# Patient Record
Sex: Male | Born: 1960 | Marital: Married | Smoking: Former smoker
Health system: Southern US, Community
[De-identification: ages and names within clinical notes are randomized; demographics above are authoritative.]

## PROBLEM LIST (undated history)

## (undated) DIAGNOSIS — H269 Unspecified cataract: Secondary | ICD-10-CM

## (undated) DIAGNOSIS — E785 Hyperlipidemia, unspecified: Secondary | ICD-10-CM

## (undated) HISTORY — PX: FOOT SURGERY: SHX648

## (undated) HISTORY — DX: Hyperlipidemia, unspecified: E78.5

## (undated) HISTORY — PX: INGUINAL HERNIA REPAIR: SUR1180

## (undated) HISTORY — DX: Unspecified cataract: H26.9

---

## 2011-08-25 ENCOUNTER — Ambulatory Visit (AMBULATORY_SURGERY_CENTER): Payer: BC Managed Care – PPO

## 2011-08-25 VITALS — Ht 68.0 in | Wt 202.0 lb

## 2011-08-25 DIAGNOSIS — Z1211 Encounter for screening for malignant neoplasm of colon: Secondary | ICD-10-CM

## 2011-08-25 DIAGNOSIS — Z800 Family history of malignant neoplasm of digestive organs: Secondary | ICD-10-CM

## 2011-08-25 MED ORDER — PEG-KCL-NACL-NASULF-NA ASC-C 100 G PO SOLR: 1.0000 | Freq: Once | ORAL | Status: DC

## 2011-09-01 ENCOUNTER — Ambulatory Visit (AMBULATORY_SURGERY_CENTER): Payer: BC Managed Care – PPO | Admitting: Gastroenterology

## 2011-09-01 ENCOUNTER — Encounter: Payer: Self-pay | Admitting: Gastroenterology

## 2011-09-01 DIAGNOSIS — D126 Benign neoplasm of colon, unspecified: Secondary | ICD-10-CM

## 2011-09-01 DIAGNOSIS — Z800 Family history of malignant neoplasm of digestive organs: Secondary | ICD-10-CM

## 2011-09-01 DIAGNOSIS — Z1211 Encounter for screening for malignant neoplasm of colon: Secondary | ICD-10-CM

## 2011-09-01 DIAGNOSIS — K5730 Diverticulosis of large intestine without perforation or abscess without bleeding: Secondary | ICD-10-CM

## 2011-09-01 MED ORDER — SODIUM CHLORIDE 0.9 % IV SOLN: 500.0000 mL | INTRAVENOUS | Status: DC

## 2011-09-01 NOTE — Op Note (Signed)
[facility] [address] [place], [place]  [address]  COLONOSCOPY PROCEDURE REPORT  PATIENT:  Roger Johnson, Roger Johnson  MR#:  #[id number] BIRTHDATE:  01-29-1961, 50 yrs. old  GENDER:  male ENDOSCOPIST:  Rachael Fee, MD REF. BY:  Tracey Harries, M.D. PROCEDURE DATE:  09/01/2011 PROCEDURE:  Colonoscopy with snare polypectomy ASA CLASS:  Class II INDICATIONS:  Elevated Risk Screening, mother had colon cancer MEDICATIONS:   Fentanyl 100 mcg IV, These medications were titrated to patient response per physician's verbal order, Versed 10 mg IV  DESCRIPTION OF PROCEDURE:   After the risks benefits and alternatives of the procedure were thoroughly explained, informed consent was obtained.  Digital rectal exam was performed and revealed no rectal masses.   The [id number] endoscope was introduced through the anus and advanced to the cecum, which was identified by both the appendix and ileocecal valve, without limitations.  The quality of the prep was good..  The instrument was then slowly withdrawn as the colon was fully examined. <<PROCEDUREIMAGES>> FINDINGS:  Three small sessile polyps were found, all were removed with cold snare and all were sent to pathology (jar 1). These ranged in size from 2-70mm across, located in transverse, descending segments (see image4 and image6).  Mild diverticulosis was found in the sigmoid to descending colon segments (see image8).  This was otherwise a normal examination of the colon (see image9, image2, and image3).   Retroflexed views in the rectum revealed no abnormalities. COMPLICATIONS:  None  ENDOSCOPIC IMPRESSION: 1) Three small polyps removed, all were removed and all were sent to pathology 2) Mild diverticulosis in the sigmoid to descending colon segments 3) Otherwise normal examination  RECOMMENDATIONS: 1) Given your significant family history of colon cancer, you should have a repeat colonoscopy in 5 years even if the  polyps removed today are NOT precancerous. 2) You will receive a letter within 1-2 weeks with the results of your biopsy as well as final recommendations. Please call my office if you have not received a letter after 3 weeks.  ______________________________ Rachael Fee, MD  n. eSIGNED:   Rachael Fee at 09/01/2011 09:38 AM  Ron Parker, #[id number]

## 2011-09-01 NOTE — Progress Notes (Signed)
Patient did not experience any of the following events: a burn prior to discharge; a fall within the facility; wrong site/side/patient/procedure/implant event; or a hospital transfer or hospital admission upon discharge from the facility. (G8907) Patient did not have preoperative order for IV antibiotic SSI prophylaxis. (G8918)  

## 2011-09-01 NOTE — Patient Instructions (Addendum)
One of your biggest health concerns is your smoking.  This increases your risk for most cancers and serious cardiovascular diseases such as strokes, heart attacks.  You should try your best to stop.  If you need assistance, please contact your PCP or Smoking Cessation Class at [facility] [phone]) or [place] (1-800-QUIT-NOW). YOU HAD AN ENDOSCOPIC PROCEDURE TODAY AT [facility]: Refer to the procedure report that was given to you for any specific questions about what was found during the examination.  If the procedure report does not answer your questions, please call your gastroenterologist to clarify.  If you requested that your care partner not be given the details of your procedure findings, then the procedure report has been included in a sealed envelope for you to review at your convenience later.  YOU SHOULD EXPECT: Some feelings of bloating in the abdomen. Passage of more gas than usual.  Walking can help get rid of the air that was put into your GI tract during the procedure and reduce the bloating. If you had a lower endoscopy (such as a colonoscopy or flexible sigmoidoscopy) you may notice spotting of blood in your stool or on the toilet paper. If you underwent a bowel prep for your procedure, then you may not have a normal bowel movement for a few days.  DIET: Your first meal following the procedure should be a light meal and then it is ok to progress to your normal diet.  A half-sandwich or bowl of soup is an example of a good first meal.  Heavy or fried foods are harder to digest and may make you feel nauseous or bloated.  Likewise meals heavy in dairy and vegetables can cause extra gas to form and this can also increase the bloating.  Drink plenty of fluids but you should avoid alcoholic beverages for 24 hours.  ACTIVITY: Your care partner should take you home directly after the procedure.  You should plan to take it easy, moving slowly for the rest of the  day.  You can resume normal activity the day after the procedure however you should NOT DRIVE or use heavy machinery for 24 hours (because of the sedation medicines used during the test).    SYMPTOMS TO REPORT IMMEDIATELY: A gastroenterologist can be reached at any hour.  During normal business hours, 8:30 AM to 5:00 PM Monday through Friday, call [phone].  After hours and on weekends, please call the GI answering service at [phone] who will take a message and have the physician on call contact you.   Following lower endoscopy (colonoscopy or flexible sigmoidoscopy):  Excessive amounts of blood in the stool  Significant tenderness or worsening of abdominal pains  Swelling of the abdomen that is new, acute  Fever of 100F or higher  Following upper endoscopy (EGD)  Vomiting of blood or coffee ground material  New chest pain or pain under the shoulder blades  Painful or persistently difficult swallowing  New shortness of breath  Fever of 100F or higher  Black, tarry-looking stools  FOLLOW UP: If any biopsies were taken you will be contacted by phone or by letter within the next 1-3 weeks.  Call your gastroenterologist if you have not heard about the biopsies in 3 weeks.  Our staff will call the home number listed on your records the next business day following your procedure to check on you and address any questions or concerns that you may have at that time regarding the information  given to you following your procedure. This is a courtesy call and so if there is no answer at the home number and we have not heard from you through the emergency physician on call, we will assume that you have returned to your regular daily activities without incident.  SIGNATURES/CONFIDENTIALITY: You and/or your care partner have signed paperwork which will be entered into your electronic medical record.  These signatures attest to the fact that that the information above on your After Visit  Summary has been reviewed and is understood.  Full responsibility of the confidentiality of this discharge information lies with you and/or your care-partner.

## 2011-09-04 ENCOUNTER — Telehealth: Payer: Self-pay

## 2011-09-04 NOTE — Telephone Encounter (Signed)
  Follow up Call-  Call back number 09/01/2011  Post procedure Call Back phone  # [phone]  Permission to leave phone message Yes     Patient questions:  Do you have a fever, pain , or abdominal swelling? no Pain Score  0 *  Have you tolerated food without any problems? yes  Have you been able to return to your normal activities? yes  Do you have any questions about your discharge instructions: Diet   no Medications  no Follow up visit  no  Do you have questions or concerns about your Care? no  Actions: * If pain score is 4 or above: No action needed, pain <4.  Per the pt he did really well. Maw

## 2011-09-06 ENCOUNTER — Encounter: Payer: Self-pay | Admitting: Gastroenterology

## 2013-09-22 DIAGNOSIS — Z87891 Personal history of nicotine dependence: Secondary | ICD-10-CM | POA: Insufficient documentation

## 2013-09-22 DIAGNOSIS — K469 Unspecified abdominal hernia without obstruction or gangrene: Secondary | ICD-10-CM | POA: Insufficient documentation

## 2013-09-22 DIAGNOSIS — R972 Elevated prostate specific antigen [PSA]: Secondary | ICD-10-CM | POA: Insufficient documentation

## 2013-09-22 DIAGNOSIS — E785 Hyperlipidemia, unspecified: Secondary | ICD-10-CM | POA: Insufficient documentation

## 2014-09-28 DIAGNOSIS — Z0000 Encounter for general adult medical examination without abnormal findings: Secondary | ICD-10-CM | POA: Insufficient documentation

## 2015-08-04 DIAGNOSIS — R972 Elevated prostate specific antigen [PSA]: Secondary | ICD-10-CM | POA: Insufficient documentation

## 2015-09-06 DIAGNOSIS — N138 Other obstructive and reflux uropathy: Secondary | ICD-10-CM | POA: Insufficient documentation

## 2015-09-06 DIAGNOSIS — N401 Enlarged prostate with lower urinary tract symptoms: Secondary | ICD-10-CM

## 2015-10-11 DIAGNOSIS — B001 Herpesviral vesicular dermatitis: Secondary | ICD-10-CM | POA: Insufficient documentation

## 2015-12-13 DIAGNOSIS — R609 Edema, unspecified: Secondary | ICD-10-CM | POA: Insufficient documentation

## 2016-01-12 DIAGNOSIS — Z1159 Encounter for screening for other viral diseases: Secondary | ICD-10-CM | POA: Insufficient documentation

## 2016-01-12 DIAGNOSIS — R739 Hyperglycemia, unspecified: Secondary | ICD-10-CM | POA: Insufficient documentation

## 2016-01-27 ENCOUNTER — Ambulatory Visit (INDEPENDENT_AMBULATORY_CARE_PROVIDER_SITE_OTHER): Payer: Managed Care, Other (non HMO) | Admitting: Podiatry

## 2016-01-27 ENCOUNTER — Encounter: Payer: Self-pay | Admitting: Podiatry

## 2016-01-27 ENCOUNTER — Ambulatory Visit (INDEPENDENT_AMBULATORY_CARE_PROVIDER_SITE_OTHER): Payer: Managed Care, Other (non HMO)

## 2016-01-27 DIAGNOSIS — M79673 Pain in unspecified foot: Secondary | ICD-10-CM | POA: Diagnosis not present

## 2016-01-27 DIAGNOSIS — M779 Enthesopathy, unspecified: Secondary | ICD-10-CM | POA: Diagnosis not present

## 2016-01-27 LAB — URIC ACID: Uric Acid, Serum: 6.3 mg/dL (ref 4.0–8.0)

## 2016-01-27 LAB — C-REACTIVE PROTEIN: CRP: <0.5 mg/dL (ref <0.60)

## 2016-01-27 LAB — RHEUMATOID FACTOR

## 2016-01-27 MED ORDER — DESOXIMETASONE 0.25 % EX CREA: 1.0000 "application " | TOPICAL_CREAM | Freq: Two times a day (BID) | CUTANEOUS | 0 refills | Status: DC

## 2016-01-27 MED ORDER — METHYLPREDNISOLONE 4 MG PO TBPK: ORAL_TABLET | ORAL | 0 refills | Status: DC

## 2016-01-27 NOTE — Progress Notes (Signed)
   Subjective:    Patient ID: Roger Johnson, male    DOB: Mar 01, 1961, 55 y.o.   MRN: [id number]  HPI: He presents today with a several week history of swelling to his bilateral lower extremity. He states that the left is worse than the right. States that he had a similar problem about a year ago was seen by an orthopedic surgeon who gave him an injection and is salt the problem. He states that has now returned he saw his primary provider who performed an echocardiogram and stated that he was fine. He is also concerned about a rash that started the same time the swelling and the pain returned. He denies a rash anywhere else on the body denies any swelling in any other joints no pain anywhere else. He denies any new shoes or wearing shoes bare footed. He does relate that he travels considerably.    Review of Systems  All other systems reviewed and are negative.      Objective:   Physical Exam: Vital signs are stable he is alert and oriented 3 in no apparent distress. Pulses are strongly palpable neurologic sensorium is intact deep tendon reflexes are intact. Muscle strength was 5 over 5 dorsiflexion plantar flexors and inverters everters all into the musculature is intact. Orthopedic evaluation does demonstrates some mild edema about the ankles overlying the sinus tarsi which appears to be the point of maximal tenderness. He has a limited subtalar joint range of motion on the right foot is post to the left foot which appears to be full and normal. He has tenderness on end range of motion of both heels. Radiographs taken today do not demonstrate any type of osseus abnormalities. Cutaneous evaluation does demonstrate a nummular rash this starts at the proximal most portion of the arch bilaterally the left extends to the lateral aspect of the foot again which appears to be a large nummular area as well developing over the [place] distribution.        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 Eczematous dermatitis plantar aspect of the bilateral foot.  Plan: I'm requesting blood work today for arthritic profile and CBC. I'm also starting him on prednisone and triamcinolone cream. I injected the bilateral sinus tarsi today with Kenalog and local anesthetic after sterile Betadine skin prep and I will follow-up with him with his labwork comes in.

## 2016-01-28 LAB — CBC WITH DIFFERENTIAL/PLATELET
BASOS ABS: 0 {cells}/uL (ref 0–200)
BASOS PCT: 0 %
EOS PCT: 2 %
Eosinophils Absolute: 134 cells/uL (ref 15–500)
HCT: 45 % (ref 38.5–50.0)
HEMOGLOBIN: 15.3 g/dL (ref 13.2–17.1)
LYMPHS ABS: 2077 {cells}/uL (ref 850–3900)
Lymphocytes Relative: 31 %
MCH: 31.9 pg (ref 27.0–33.0)
MCHC: 34 g/dL (ref 32.0–36.0)
MCV: 93.8 fL (ref 80.0–100.0)
MPV: 9.8 fL (ref 7.5–12.5)
Monocytes Absolute: 804 cells/uL (ref 200–950)
Monocytes Relative: 12 %
NEUTROS ABS: 3685 {cells}/uL (ref 1500–7800)
Neutrophils Relative %: 55 %
Platelets: 226 10*3/uL (ref 140–400)
RBC: 4.8 MIL/uL (ref 4.20–5.80)
RDW: 13.2 % (ref 11.0–15.0)
WBC: 6.7 10*3/uL (ref 3.8–10.8)

## 2016-01-28 LAB — ANA: ANA: NEGATIVE

## 2016-01-28 LAB — SEDIMENTATION RATE: Sed Rate: 1 mm/hr (ref 0–20)

## 2016-02-24 ENCOUNTER — Ambulatory Visit: Payer: Managed Care, Other (non HMO) | Admitting: Podiatry

## 2016-03-07 ENCOUNTER — Ambulatory Visit (INDEPENDENT_AMBULATORY_CARE_PROVIDER_SITE_OTHER): Payer: Managed Care, Other (non HMO) | Admitting: Podiatry

## 2016-03-07 ENCOUNTER — Encounter: Payer: Self-pay | Admitting: Podiatry

## 2016-03-07 DIAGNOSIS — L309 Dermatitis, unspecified: Secondary | ICD-10-CM | POA: Diagnosis not present

## 2016-03-07 DIAGNOSIS — M779 Enthesopathy, unspecified: Secondary | ICD-10-CM

## 2016-03-07 MED ORDER — DESOXIMETASONE 0.25 % EX CREA: 1.0000 "application " | TOPICAL_CREAM | Freq: Two times a day (BID) | CUTANEOUS | 0 refills | Status: DC

## 2016-03-07 NOTE — Progress Notes (Signed)
He presents today for follow-up of his subtalar joint capsulitis and dermatitis bilateral. He states that he is doing much better. He states I think one more shot will help and I would like a refill on medication.  Objective: Vital signs are stable he is alert 3 near-complete resolution of the dermatitis to the plantar and plantar lateral aspect of the bilateral foot. He also has minimal edema to the anterior anterolateral ankles but tender on palpation to the sinus tarsi.  Assessment: Capsulitis of the subtalar joints bilateral. Dermatitis resolving.  Plan: Reinjected his bilateral sinus tarsitis today with Kenalog and local anesthetic after sterile Betadine skin prep and refilled his Topicort. Follow up with him as needed. I did recommend that if he started to feel pain again that he is to notify us immediately and come in for a uric acid requisition.

## 2016-07-10 ENCOUNTER — Encounter: Payer: Self-pay | Admitting: Gastroenterology

## 2016-09-04 ENCOUNTER — Encounter: Payer: Self-pay | Admitting: Podiatry

## 2016-09-04 ENCOUNTER — Ambulatory Visit (INDEPENDENT_AMBULATORY_CARE_PROVIDER_SITE_OTHER): Payer: 59 | Admitting: Podiatry

## 2016-09-04 DIAGNOSIS — M7750 Other enthesopathy of unspecified foot: Secondary | ICD-10-CM

## 2016-09-04 DIAGNOSIS — M779 Enthesopathy, unspecified: Secondary | ICD-10-CM

## 2016-09-04 NOTE — Progress Notes (Signed)
He presents today with a chief complaint of painful subtalar joints bilateral. He states that his rash healed up quite nicely. States that his ankles have only been hurting him for the past couple of weeks.  Objective: Vital signs are stable alert and oriented 3. Pulses are palpable. Neurologic sensorium is intact. Deep tendon reflexes are intact. Muscle strength is intact. He has pain on end range of motion of the subtalar joints bilateral.  Assessment: Subtalar joint capsulitis bilateral.  Plan: He was scanned for set of orthotics today and also injected bilateral subtalar joints.

## 2016-09-08 DIAGNOSIS — R7309 Other abnormal glucose: Secondary | ICD-10-CM | POA: Insufficient documentation

## 2016-09-20 DIAGNOSIS — H25091 Other age-related incipient cataract, right eye: Secondary | ICD-10-CM | POA: Insufficient documentation

## 2016-10-09 ENCOUNTER — Ambulatory Visit: Payer: 59 | Admitting: Podiatry

## 2016-10-11 ENCOUNTER — Encounter: Payer: Self-pay | Admitting: *Deleted

## 2016-10-11 ENCOUNTER — Ambulatory Visit: Payer: 59 | Admitting: Podiatry

## 2016-10-18 ENCOUNTER — Ambulatory Visit (INDEPENDENT_AMBULATORY_CARE_PROVIDER_SITE_OTHER): Payer: 59 | Admitting: Podiatry

## 2016-10-18 ENCOUNTER — Encounter: Payer: Self-pay | Admitting: Podiatry

## 2016-10-18 ENCOUNTER — Encounter: Payer: Self-pay | Admitting: Family Medicine

## 2016-10-18 DIAGNOSIS — M779 Enthesopathy, unspecified: Secondary | ICD-10-CM

## 2016-10-18 NOTE — Progress Notes (Signed)
Dispensed patient's orthotics with oral and written instructions for wearing. Patient will follow up with Dr. Milinda Pointer in 6 week for an orthotic check.

## 2016-10-18 NOTE — Patient Instructions (Signed)

## 2017-07-03 ENCOUNTER — Other Ambulatory Visit: Payer: Self-pay | Admitting: *Deleted

## 2017-07-03 MED ORDER — DESOXIMETASONE 0.25 % EX CREA: 1.0000 "application " | TOPICAL_CREAM | Freq: Two times a day (BID) | CUTANEOUS | 1 refills | Status: DC

## 2017-07-04 ENCOUNTER — Encounter: Payer: Self-pay | Admitting: Gastroenterology

## 2017-09-18 ENCOUNTER — Other Ambulatory Visit: Payer: Self-pay

## 2017-09-18 ENCOUNTER — Ambulatory Visit (AMBULATORY_SURGERY_CENTER): Payer: Self-pay

## 2017-09-18 VITALS — Ht 70.0 in | Wt 207.8 lb

## 2017-09-18 DIAGNOSIS — Z86010 Personal history of colonic polyps: Secondary | ICD-10-CM

## 2017-09-18 MED ORDER — PEG 3350-KCL-NA BICARB-NACL 420 G PO SOLR: 4000.0000 mL | Freq: Once | ORAL | 0 refills | Status: DC

## 2017-09-19 ENCOUNTER — Encounter: Payer: Self-pay | Admitting: Gastroenterology

## 2017-09-24 HISTORY — PX: COLONOSCOPY: SHX174

## 2017-10-02 ENCOUNTER — Encounter: Payer: Self-pay | Admitting: Gastroenterology

## 2017-10-02 ENCOUNTER — Ambulatory Visit (AMBULATORY_SURGERY_CENTER): Payer: BLUE CROSS/BLUE SHIELD | Admitting: Gastroenterology

## 2017-10-02 ENCOUNTER — Other Ambulatory Visit: Payer: Self-pay

## 2017-10-02 VITALS — BP 118/72 | HR 68 | Temp 98.6°F | Resp 12 | Wt 207.0 lb

## 2017-10-02 DIAGNOSIS — D122 Benign neoplasm of ascending colon: Secondary | ICD-10-CM

## 2017-10-02 DIAGNOSIS — D126 Benign neoplasm of colon, unspecified: Secondary | ICD-10-CM | POA: Diagnosis not present

## 2017-10-02 DIAGNOSIS — Z800 Family history of malignant neoplasm of digestive organs: Secondary | ICD-10-CM | POA: Diagnosis not present

## 2017-10-02 DIAGNOSIS — D125 Benign neoplasm of sigmoid colon: Secondary | ICD-10-CM

## 2017-10-02 DIAGNOSIS — K5730 Diverticulosis of large intestine without perforation or abscess without bleeding: Secondary | ICD-10-CM

## 2017-10-02 DIAGNOSIS — K635 Polyp of colon: Secondary | ICD-10-CM | POA: Diagnosis not present

## 2017-10-02 DIAGNOSIS — Z86010 Personal history of colonic polyps: Secondary | ICD-10-CM | POA: Diagnosis present

## 2017-10-02 MED ORDER — SODIUM CHLORIDE 0.9 % IV SOLN: 500.0000 mL | Freq: Once | INTRAVENOUS | Status: DC

## 2017-10-02 NOTE — Patient Instructions (Signed)
YOU HAD AN ENDOSCOPIC PROCEDURE TODAY AT THE [facility]:   Refer to the procedure report that was given to you for any specific questions about what was found during the examination.  If the procedure report does not answer your questions, please call your gastroenterologist to clarify.  If you requested that your care partner not be given the details of your procedure findings, then the procedure report has been included in a sealed envelope for you to review at your convenience later.  YOU SHOULD EXPECT: Some feelings of bloating in the abdomen. Passage of more gas than usual.  Walking can help get rid of the air that was put into your GI tract during the procedure and reduce the bloating. If you had a lower endoscopy (such as a colonoscopy or flexible sigmoidoscopy) you may notice spotting of blood in your stool or on the toilet paper. If you underwent a bowel prep for your procedure, you may not have a normal bowel movement for a few days.  Please Note:  You might notice some irritation and congestion in your nose or some drainage.  This is from the oxygen used during your procedure.  There is no need for concern and it should clear up in a day or so.  SYMPTOMS TO REPORT IMMEDIATELY:   Following lower endoscopy (colonoscopy or flexible sigmoidoscopy):  Excessive amounts of blood in the stool  Significant tenderness or worsening of abdominal pains  Swelling of the abdomen that is new, acute  Fever of 100F or higher   For urgent or emergent issues, a gastroenterologist can be reached at any hour by calling [phone].   DIET:  We do recommend a small meal at first, but then you may proceed to your regular diet.  Drink plenty of fluids but you should avoid alcoholic beverages for 24 hours. Try to increase the fiber in your diet, and drink plenty of water.  ACTIVITY:  You should plan to take it easy for the rest of today and you should NOT DRIVE or use heavy machinery until  tomorrow (because of the sedation medicines used during the test).    FOLLOW UP: Our staff will call the number listed on your records the next business day following your procedure to check on you and address any questions or concerns that you may have regarding the information given to you following your procedure. If we do not reach you, we will leave a message.  However, if you are feeling well and you are not experiencing any problems, there is no need to return our call.  We will assume that you have returned to your regular daily activities without incident.  If any biopsies were taken you will be contacted by phone or by letter within the next 1-3 weeks.  Please call us at [phone] if you have not heard about the biopsies in 3 weeks.    SIGNATURES/CONFIDENTIALITY: You and/or your care partner have signed paperwork which will be entered into your electronic medical record.  These signatures attest to the fact that that the information above on your After Visit Summary has been reviewed and is understood.  Full responsibility of the confidentiality of this discharge information lies with you and/or your care-partner.  Read all handouts given to you by your recovery room nurse. 

## 2017-10-02 NOTE — Progress Notes (Signed)
Called to room to assist during endoscopic procedure.  Patient ID and intended procedure confirmed with present staff. Received instructions for my participation in the procedure from the performing physician.  

## 2017-10-02 NOTE — Op Note (Signed)
[facility] Patient Name: Roger Johnson Procedure Date: 10/02/2017 9:23 AM MRN: [id number] Endoscopist: Milus Banister , MD Age: 57 Referring MD:  Date of Birth: 1961-05-18 Gender: Male Account #: [id number] Procedure:                Colonoscopy Indications:              High risk colon cancer surveillance: Personal                            history of colonic polyps (colonoscopy 2013 two                            subCM adenomas), also mother had colon cancer                            (diagnosed at age 65) Medicines:                Monitored Anesthesia Care Procedure:                Pre-Anesthesia Assessment:                           - Prior to the procedure, a History and Physical                            was performed, and patient medications and                            allergies were reviewed. The patient's tolerance of                            previous anesthesia was also reviewed. The risks                            and benefits of the procedure and the sedation                            options and risks were discussed with the patient.                            All questions were answered, and informed consent                            was obtained. Prior Anticoagulants: The patient has                            taken no previous anticoagulant or antiplatelet                            agents. ASA Grade Assessment: II - A patient with                            mild systemic disease. After reviewing the risks  and benefits, the patient was deemed in                            satisfactory condition to undergo the procedure.                           After obtaining informed consent, the colonoscope                            was passed under direct vision. Throughout the                            procedure, the patient's blood pressure, pulse, and                            oxygen saturations were monitored continuously. The                             Model CF-HQ190L [id number]) scope was introduced                            through the anus and advanced to the the cecum,                            identified by appendiceal orifice and ileocecal                            valve. The colonoscopy was performed without                            difficulty. The patient tolerated the procedure                            well. The quality of the bowel preparation was                            good. The ileocecal valve, appendiceal orifice, and                            rectum were photographed. Scope In: 9:31:14 AM Scope Out: 9:42:01 AM Scope Withdrawal Time: 0 hours 8 minutes 9 seconds  Total Procedure Duration: 0 hours 10 minutes 47 seconds  Findings:                 Two sessile polyps were found in the sigmoid colon                            and ascending colon. The polyps were 2 to 4 mm in                            size. These polyps were removed with a cold snare.                            Resection and retrieval were  complete.                           A few small-mouthed diverticula were found in the                            left colon.                           The exam was otherwise without abnormality on                            direct and retroflexion views. Complications:            No immediate complications. Estimated blood loss:                            None. Estimated Blood Loss:     Estimated blood loss: none. Impression:               - Two 2 to 4 mm polyps in the sigmoid colon and in                            the ascending colon, removed with a cold snare.                            Resected and retrieved.                           - Diverticulosis in the left colon.                           - The examination was otherwise normal on direct                            and retroflexion views. Recommendation:           - Patient has a contact number available for                             emergencies. The signs and symptoms of potential                            delayed complications were discussed with the                            patient. Return to normal activities tomorrow.                            Written discharge instructions were provided to the                            patient.                           - Resume previous diet.                           -  Continue present medications.                           You will receive a letter within 2-3 weeks with the                            pathology results and my final recommendations.                           If the polyp(s) is proven to be 'pre-cancerous' on                            pathology, you will need repeat colonoscopy in 5                            years. Milus Banister, MD 10/02/2017 9:46:51 AM This report has been signed electronically.

## 2017-10-02 NOTE — Progress Notes (Signed)
To PACU, VSS. Report to [email or web address] 

## 2017-10-03 ENCOUNTER — Telehealth: Payer: Self-pay

## 2017-10-03 ENCOUNTER — Telehealth: Payer: Self-pay | Admitting: *Deleted

## 2017-10-03 NOTE — Telephone Encounter (Signed)
  Follow up Call-  Call back number 10/02/2017  Post procedure Call Back phone  # [phone]  Permission to leave phone message Yes  Some recent data might be hidden     Patient questions:  Do you have a fever, pain , or abdominal swelling? No. Pain Score  0 *  Have you tolerated food without any problems? Yes.    Have you been able to return to your normal activities? Yes.    Do you have any questions about your discharge instructions: Diet   No. Medications  No. Follow up visit  No.  Do you have questions or concerns about your Care? No.  Actions: * If pain score is 4 or above: No action needed, pain <4.

## 2017-10-03 NOTE — Telephone Encounter (Signed)
NO ANSWER, MESSAGE LEFT FOR PATIENT. 

## 2017-10-08 ENCOUNTER — Encounter: Payer: Self-pay | Admitting: Gastroenterology

## 2018-03-26 ENCOUNTER — Other Ambulatory Visit: Payer: Self-pay | Admitting: Ophthalmology

## 2018-09-28 ENCOUNTER — Other Ambulatory Visit: Payer: Self-pay | Admitting: Podiatry

## 2018-11-20 DIAGNOSIS — M7711 Lateral epicondylitis, right elbow: Secondary | ICD-10-CM | POA: Insufficient documentation

## 2019-10-15 ENCOUNTER — Encounter: Payer: Self-pay | Admitting: Podiatry

## 2019-10-15 ENCOUNTER — Ambulatory Visit (INDEPENDENT_AMBULATORY_CARE_PROVIDER_SITE_OTHER): Payer: Managed Care, Other (non HMO) | Admitting: Podiatry

## 2019-10-15 ENCOUNTER — Other Ambulatory Visit: Payer: Self-pay

## 2019-10-15 ENCOUNTER — Ambulatory Visit (INDEPENDENT_AMBULATORY_CARE_PROVIDER_SITE_OTHER): Payer: Managed Care, Other (non HMO)

## 2019-10-15 VITALS — Temp 97.4°F

## 2019-10-15 DIAGNOSIS — M7752 Other enthesopathy of left foot: Secondary | ICD-10-CM

## 2019-10-15 NOTE — Progress Notes (Signed)
He presents today with chief complaint of pain to the dorsal lateral aspect of his left foot and ankle.  Feels like something pinching in the area and then it goes away.  Objective: Vital signs are stable he is alert oriented x3 there is no erythema edema cellulitis drainage or odor he has pain on palpation sinus tarsi and pain on end range of motion.  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  Assessment: Subtalar joint changes.  With capsulitis.   Plan: I injected 20 mg Kenalog 5 mg Marcaine point maximal tenderness after sterile Betadine skin prep left sinus tarsi.  Tolerated procedure well follow-up with him as needed

## 2020-02-18 ENCOUNTER — Encounter: Payer: Self-pay | Admitting: Podiatry

## 2020-02-18 ENCOUNTER — Ambulatory Visit (INDEPENDENT_AMBULATORY_CARE_PROVIDER_SITE_OTHER): Payer: Managed Care, Other (non HMO) | Admitting: Podiatry

## 2020-02-18 ENCOUNTER — Other Ambulatory Visit: Payer: Self-pay

## 2020-02-18 DIAGNOSIS — S96912A Strain of unspecified muscle and tendon at ankle and foot level, left foot, initial encounter: Secondary | ICD-10-CM

## 2020-02-18 MED ORDER — METHYLPREDNISOLONE 4 MG PO TBPK: ORAL_TABLET | ORAL | 0 refills | Status: DC

## 2020-02-18 MED ORDER — MELOXICAM 15 MG PO TABS: 15.0000 mg | ORAL_TABLET | Freq: Every day | ORAL | 3 refills | Status: DC

## 2020-02-18 NOTE — Progress Notes (Signed)
He presents today still having severe pain and swelling on the lateral aspect of his left foot and ankle.  He states that the injection that we gave him made it feel better but it still swells considerably.  He states that he takes an Aleve or an Advil which does help occasionally.  Objective: Vital signs are stable he is alert oriented x3.  Pulses are palpable.  Left ankle demonstrates edema along the lateral malleolus as well as beneath the lateral malleolus.  On palpation is easily palpable that is peroneus longus is a functioning very well and then on proximal evaluation just behind the lateral malleolus there appears to be a nodule present.  This nodule is more than likely a tear of the peroneus longus that is resulted in retraction.  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  Plan: At this point were going to request an MRI of the left ankle evaluate the tear in the peroneus longus for surgical consideration and planning.  I also want to go ahead and start him on methylprednisolone just to help alleviate his symptoms I do think in taking that as well as a single meloxicam daily I will help alleviate his symptoms until we can work on this.  He will also continue to wear his compression anklet as he travels a lot.

## 2020-02-19 ENCOUNTER — Other Ambulatory Visit: Payer: Self-pay | Admitting: Podiatry

## 2020-02-20 ENCOUNTER — Other Ambulatory Visit: Payer: Self-pay

## 2020-03-02 ENCOUNTER — Ambulatory Visit: Payer: Self-pay

## 2020-03-05 ENCOUNTER — Other Ambulatory Visit: Payer: Self-pay

## 2020-03-05 DIAGNOSIS — S96912A Strain of unspecified muscle and tendon at ankle and foot level, left foot, initial encounter: Secondary | ICD-10-CM

## 2020-03-08 ENCOUNTER — Telehealth: Payer: Self-pay | Admitting: Podiatry

## 2020-03-08 NOTE — Telephone Encounter (Signed)
[name] (Social research officer, government) called on his behalf to check status of authorization and if they can schedule MRI yet with [facility] in [place] (he is in [place] currently)

## 2020-03-10 NOTE — Telephone Encounter (Signed)
Returned call to [facility] and left vm that order has been refaxed to [facility] and he should be able to call and schedule his appt for MRI.

## 2020-04-07 ENCOUNTER — Ambulatory Visit: Payer: Managed Care, Other (non HMO) | Admitting: Podiatry

## 2020-04-07 ENCOUNTER — Other Ambulatory Visit: Payer: Self-pay

## 2020-04-07 ENCOUNTER — Encounter: Payer: Self-pay | Admitting: Podiatry

## 2020-04-07 DIAGNOSIS — M7672 Peroneal tendinitis, left leg: Secondary | ICD-10-CM

## 2020-04-07 NOTE — Progress Notes (Signed)
He presents today for follow-up of his peroneal tendinitis of his left foot and ankle.  He states that is still sore but as long as he wears his compression garment he feels much better.  He states that he is able to play tennis to some degree but nothing crazy.  Objective: Vital signs are stable he is alert oriented x3 pulses are palpable still has swelling and tenderness along the peroneal tendons particularly posterior to the lateral malleolus.  MRI does indicate a tear of the peroneus brevis about 2 mm from the right foot concluding prior to attachment of the fifth metatarsal.  Peroneus longus appears to demonstrate peroneal tendinitis also has some osteoarthritis of the fourth fifth tarsometatarsal joint left.  Assessment: Peroneal tendinitis peroneal tear osteoarthritis.  Plan: At this point I injected dexamethasone local anesthetic to the point of maximal tenderness I will follow-up with him in 6 weeks for surgical consult for January.  He understands that he is going to be in a cast for 4 to 6 weeks and I will follow-up with him at that time.

## 2020-05-26 ENCOUNTER — Encounter: Payer: Self-pay | Admitting: Podiatry

## 2020-05-26 ENCOUNTER — Other Ambulatory Visit: Payer: Self-pay

## 2020-05-26 ENCOUNTER — Ambulatory Visit: Payer: Managed Care, Other (non HMO) | Admitting: Podiatry

## 2020-05-26 DIAGNOSIS — M7672 Peroneal tendinitis, left leg: Secondary | ICD-10-CM | POA: Diagnosis not present

## 2020-05-26 NOTE — Progress Notes (Signed)
He presents today for follow-up of his peroneal tendinitis states that have started PT at home on my own using Voltaren gel and ice therapy and a TENS unit.  He states that is really been helping a lot he states that he is about 80 to 90% improved at this point.  Objective: Vital signs stable he is alert and oriented x3 pulses are palpable.  He has no reproducible pain on palpation or range of motion.  Assessment: Well-healing tears of the peroneal tendons left.  Plan: Discussed etiology pathology and surgical therapies this point time we will continue current therapies and I did place him in a Tri-Lock brace to help with tennis and I will follow-up with him as needed.

## 2020-06-30 ENCOUNTER — Ambulatory Visit: Payer: Managed Care, Other (non HMO) | Admitting: Podiatry

## 2020-07-01 ENCOUNTER — Ambulatory Visit: Payer: Managed Care, Other (non HMO) | Admitting: Podiatry

## 2020-07-01 ENCOUNTER — Other Ambulatory Visit: Payer: Self-pay

## 2020-07-01 DIAGNOSIS — S86312A Strain of muscle(s) and tendon(s) of peroneal muscle group at lower leg level, left leg, initial encounter: Secondary | ICD-10-CM

## 2020-07-01 DIAGNOSIS — M19079 Primary osteoarthritis, unspecified ankle and foot: Secondary | ICD-10-CM

## 2020-07-02 ENCOUNTER — Encounter: Payer: Self-pay | Admitting: Podiatry

## 2020-07-05 NOTE — Progress Notes (Signed)
Subjective:  Patient ID: Roger Johnson, male    DOB: 09-09-1960,  MRN: [id number]  Chief Complaint  Patient presents with  . peroneal teninitis    PT stated he has a split tendon to the left ankle    60 y.o. male presents with the above complaint. History confirmed with patient.  This is a chronic issue for him that has been going on for some time.  He has previously seen Dr. Milinda Pointer for this and has undergone injections which helped somewhat but are no longer helpful.  He saw orthopedist at [facility] who discussed the tendon tear with him found on the MRI that Dr. Milinda Pointer sent him for in October of this year.  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  He also knows Dr. Paulla Dolly here at this practice who referred him to me for surgical evaluation.  Objective:  Physical Exam: warm, good capillary refill, no trophic changes or ulcerative lesions, normal DP and PT pulses and normal sensory exam. Left Foot: He has pain in the retromalleolar groove along the peroneal tendons.  5 out of 5 strength but does have pain with resisted eversion of the foot.  There is an area of swelling and edema in the retromalleolar groove as well.  No pain at the insertion of the fifth metatarsal base or in the plantar arch.  No ankle instability.   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 No intravenous contrast was administered.  COMPARISON: None.  FINDINGS: TENDONS  Peroneal: There is a longitudinal split tear of the peroneal brevis tendon at the level of the hindfoot of the which appears to extend approximately 2 cm in length with a normal distal insertion site. There is increased intrasubstance signal seen within the peroneal longus tendon. Fluid is seen surrounding  the peroneal tendons.  Posteromedial: Posterior tibial tendon intact. Flexor hallucis longus tendon intact. Flexor digitorum longus tendon intact.  Anterior: Tibialis anterior tendon intact. Extensor hallucis longus tendon intact Extensor digitorum longus tendon intact.  Achilles: There is mildly increased signal seen within a 2 cm segment of Achilles tendon at the insertion site.  Plantar Fascia: Intact.  LIGAMENTS  Lateral: Anterior talofibular ligament intact. Calcaneofibular ligament intact. Posterior talofibular ligament intact. Anterior and posterior tibiofibular ligaments intact.  Medial: There is heterogeneous increased signal seen within the deltoid ligament, however it is intact.[place] ligament intact.  CARTILAGE  Ankle Joint: No joint effusion. Normal ankle mortise. No chondral defect.  Subtalar Joints/Sinus Tarsi: Normal subtalar joints. No subtalar joint effusion. Normal sinus tarsi.  Bones: Advanced osteoarthritis is seen at the calcaneocuboid joint with joint space loss, surrounding marrow edema and subchondral cystic changes. There is also a small amount of joint fluid extending from the calcaneocuboid joint laterally.  Soft Tissue: Mild soft tissue edema seen on the plantar surface of the midfoot adjacent to the calcaneal cuboid joint. There is also soft tissue edema seen around the lateral ankle. A small amount of fluid is seen within the syndesmosis.  IMPRESSION: Longitudinal split tear of the peroneal brevis tendon at the level of the hindfoot with a normal distal insertion site.  Mild-to-moderate peroneal tenosynovitis  Advanced calcaneocuboid joint osteoarthritis  Soft tissue edema around the lateral plantar surface of the midfoot and the lateral ankle   Electronically Signed  By: Prudencio Pair M.D. On: 03/29/2020 22:09 Assessment:   1. Tear of peroneal tendon, left, initial encounter   2. Osteoarthritis of calcaneocuboid joint       Plan:  Patient was evaluated and treated and all questions answered.   Discussed with the patient his radiographic and clinical findings.  His MRI report showed a 2 cm split tear in the peroneus brevis along the lateral ankle with normal distal insertion.  I discussed with him how his cavus foot type contributes to this.  He has no findings of ankle instability.  We discussed repair of the tear.  So far he has not improved with nonsurgical treatment.  He desires surgical correction.  We discussed the possible risks and benefits of this including not limited to pain, swelling, infection, scar, numbness which may be temporary or permanent, chronic pain, stiffness, nerve pain or damage, wound healing problems.  I do not think at this point that considering cavus reconstruction would be necessary although he is at a higher risk for retearing this.  If this occurs it would be something to consider for the future.  Informed consent was signed and reviewed and surgery scheduled mutually agreeable date.  We discussed the postoperative course including the period of nonweightbearing for approximately 1 month and then transition to protected weightbearing in a CAM boot and physical therapy.   Following the visit I was able to independently review the images on the CD that he brought before his MRI.  It does note in the images show significant osteoarthrosis of the calcaneocuboid joint.  He did not seem to have a significant amount of pain clinically here today.  I discussed with him that prior to surgery he could return and we could perform a diagnostic corticosteroid injection to see if this offers any pain relief.  If it does then I think it is reasonable to consider discussing the option of calcaneocuboid arthrodesis.  If not then continue with nonsurgical treatment for the arthritis and proceed with primary tendon repair.   No follow-ups on file.

## 2020-07-07 ENCOUNTER — Ambulatory Visit: Payer: Managed Care, Other (non HMO) | Admitting: Podiatry

## 2020-07-08 ENCOUNTER — Telehealth: Payer: Self-pay

## 2020-07-08 ENCOUNTER — Other Ambulatory Visit: Payer: Self-pay

## 2020-07-08 ENCOUNTER — Ambulatory Visit: Payer: Managed Care, Other (non HMO) | Admitting: Podiatry

## 2020-07-08 DIAGNOSIS — M19079 Primary osteoarthritis, unspecified ankle and foot: Secondary | ICD-10-CM

## 2020-07-08 DIAGNOSIS — S86312A Strain of muscle(s) and tendon(s) of peroneal muscle group at lower leg level, left leg, initial encounter: Secondary | ICD-10-CM

## 2020-07-08 NOTE — Telephone Encounter (Signed)
DOS 07/23/2020  REPAIR TORN TENDON LT - 28202  [employer] EFFECTIVE DATE - 12/25/2018  PLAN DEDUCTIBLE - $2500.00 D/$030.13 MET OUT OF POCKET - $5000.00 H/$438.88 MET   PER AUTOMATED SYSTEM, NO PRECERT REQUIRED FOR CPT 28202. CONF # [id number]

## 2020-07-11 ENCOUNTER — Encounter: Payer: Self-pay | Admitting: Podiatry

## 2020-07-11 NOTE — Progress Notes (Signed)
I asked him to return following review of his MRI to evaluate his calcaneocuboid joint.  On further discussion and examination he does not have pain that is significantly consistent in the calcaneocuboid joint, he has predominantly pain along the peroneal tendons, the point of maximal tenderness is in the retromalleolar groove.  We decided that we will proceed with peroneal tendon repair as previously proposed, if the CCJ becomes an issue later we will consider injection therapy and potential arthrodesis if required.  Addressed further questions about his procedure as well as the postoperative course.

## 2020-07-23 ENCOUNTER — Other Ambulatory Visit: Payer: Self-pay | Admitting: Podiatry

## 2020-07-23 DIAGNOSIS — M65872 Other synovitis and tenosynovitis, left ankle and foot: Secondary | ICD-10-CM

## 2020-07-23 DIAGNOSIS — M66372 Spontaneous rupture of flexor tendons, left ankle and foot: Secondary | ICD-10-CM | POA: Diagnosis not present

## 2020-07-23 DIAGNOSIS — M7672 Peroneal tendinitis, left leg: Secondary | ICD-10-CM | POA: Diagnosis not present

## 2020-07-23 MED ORDER — OXYCODONE HCL 5 MG PO TABS
5.0000 mg | ORAL_TABLET | ORAL | 0 refills | Status: AC | PRN
Start: 2020-07-23 — End: 2020-07-30

## 2020-07-23 MED ORDER — IBUPROFEN 600 MG PO TABS: 600.0000 mg | ORAL_TABLET | Freq: Four times a day (QID) | ORAL | 0 refills | Status: AC | PRN

## 2020-07-23 MED ORDER — OXYCODONE HCL 5 MG PO TABS: 5.0000 mg | ORAL_TABLET | ORAL | 0 refills | Status: DC | PRN

## 2020-07-23 MED ORDER — ASPIRIN EC 325 MG PO TBEC: 325.0000 mg | DELAYED_RELEASE_TABLET | Freq: Two times a day (BID) | ORAL | 0 refills | Status: AC

## 2020-07-23 MED ORDER — ACETAMINOPHEN 500 MG PO TABS: 1000.0000 mg | ORAL_TABLET | Freq: Four times a day (QID) | ORAL | 0 refills | Status: AC | PRN

## 2020-07-23 MED ORDER — GABAPENTIN 300 MG PO CAPS: 300.0000 mg | ORAL_CAPSULE | Freq: Three times a day (TID) | ORAL | 0 refills | Status: DC

## 2020-07-23 NOTE — Addendum Note (Signed)
Addended bySherryle Lis, Elenora Hawbaker R on: 07/23/2020 09:46 AM   Modules accepted: Orders

## 2020-07-23 NOTE — Progress Notes (Signed)
07/23/20 [place] peroneal tendon repair left

## 2020-07-27 ENCOUNTER — Encounter: Payer: Self-pay | Admitting: Podiatry

## 2020-07-29 ENCOUNTER — Encounter: Payer: Self-pay | Admitting: Podiatry

## 2020-07-29 ENCOUNTER — Other Ambulatory Visit: Payer: Self-pay

## 2020-07-29 ENCOUNTER — Ambulatory Visit (INDEPENDENT_AMBULATORY_CARE_PROVIDER_SITE_OTHER): Payer: Managed Care, Other (non HMO) | Admitting: Podiatry

## 2020-07-29 DIAGNOSIS — S86312A Strain of muscle(s) and tendon(s) of peroneal muscle group at lower leg level, left leg, initial encounter: Secondary | ICD-10-CM

## 2020-07-29 DIAGNOSIS — Z9889 Other specified postprocedural states: Secondary | ICD-10-CM

## 2020-07-30 ENCOUNTER — Encounter: Payer: Self-pay | Admitting: Podiatry

## 2020-07-30 NOTE — Progress Notes (Signed)
  Subjective:  Patient ID: Roger Johnson, male    DOB: Mar 18, 1961,  MRN: [id number]  Chief Complaint  Patient presents with  . Routine Post Op    PT stated that he is doing well he has no pain and no major concerns at this time    DOS: 07/23/20 Procedure: left ankle peroneus brevis  Tendon reconstruction with tenodesis and allografting  59 y.o. male returns for post-op check. Doing well. Minimal pain  Review of Systems: Negative except as noted in the HPI. Denies N/V/F/Ch.   Objective:  There were no vitals filed for this visit. There is no height or weight on file to calculate BMI. Constitutional Well developed. Well nourished.  Vascular Foot warm and well perfused. Capillary refill normal to all digits.   Neurologic Normal speech. Oriented to person, place, and time. Epicritic sensation to light touch grossly present bilaterally.  Dermatologic Skin healing well without signs of infection. Skin edges well coapted without signs of infection.  Orthopedic: Tenderness to palpation noted about the surgical site.    Assessment:  No diagnosis found. Plan:  Patient was evaluated and treated and all questions answered.  S/p foot surgery left -Progressing as expected post-operatively. -WB Status: NWB in posterior splint -Sutures: will leave intact for 1 more week. -Medications:  No refills required. Continue aspirin -Foot redressed.  Return in about 1 week (around 08/05/2020) for suture removal POV #2.

## 2020-08-05 ENCOUNTER — Ambulatory Visit (INDEPENDENT_AMBULATORY_CARE_PROVIDER_SITE_OTHER): Payer: Managed Care, Other (non HMO) | Admitting: Podiatry

## 2020-08-05 ENCOUNTER — Other Ambulatory Visit: Payer: Self-pay

## 2020-08-05 DIAGNOSIS — Z9889 Other specified postprocedural states: Secondary | ICD-10-CM

## 2020-08-05 DIAGNOSIS — S86312A Strain of muscle(s) and tendon(s) of peroneal muscle group at lower leg level, left leg, initial encounter: Secondary | ICD-10-CM

## 2020-08-05 NOTE — Patient Instructions (Signed)
You may begin bathing. OK to remove the boot and ACE to bathe  Sleep in the boot. No weight on the foot yet  After next visit we will begin WB in the boot and physical therapy

## 2020-08-08 ENCOUNTER — Encounter: Payer: Self-pay | Admitting: Podiatry

## 2020-08-08 NOTE — Progress Notes (Signed)
  Subjective:  Patient ID: Roger Johnson, male    DOB: [birth year],  MRN: [id number]  Chief Complaint  Patient presents with  . Routine Post Op    Pt stated that he is doing well he denies pain at this time and has no major concerns     DOS: 07/23/20 Procedure: left ankle peroneus brevis  Tendon reconstruction with tenodesis and allografting  60 y.o. male returns for post-op check. Doing well. Minimal pain  Review of Systems: Negative except as noted in the HPI. Denies N/V/F/Ch.   Objective:  There were no vitals filed for this visit. There is no height or weight on file to calculate BMI. Constitutional Well developed. Well nourished.  Vascular Foot warm and well perfused. Capillary refill normal to all digits.   Neurologic Normal speech. Oriented to person, place, and time. Epicritic sensation to light touch grossly present bilaterally.  Dermatologic Skin healing well without signs of infection. Skin edges well coapted without signs of infection.  Orthopedic: Tenderness to palpation noted about the surgical site.    Assessment:  No diagnosis found. Plan:  Patient was evaluated and treated and all questions answered.  S/p foot surgery left -Progressing as expected post-operatively. -WB Status: NWB in CAM boot with plantarflexion wedges -Sutures: removed today, steri strips applied -Medications:  No refills required. Continue aspirin -Foot redressed.  No follow-ups on file.

## 2020-08-09 ENCOUNTER — Encounter: Payer: Self-pay | Admitting: Podiatry

## 2020-08-24 ENCOUNTER — Ambulatory Visit (INDEPENDENT_AMBULATORY_CARE_PROVIDER_SITE_OTHER): Payer: Managed Care, Other (non HMO) | Admitting: Podiatry

## 2020-08-24 ENCOUNTER — Other Ambulatory Visit: Payer: Self-pay

## 2020-08-24 DIAGNOSIS — S86312A Strain of muscle(s) and tendon(s) of peroneal muscle group at lower leg level, left leg, initial encounter: Secondary | ICD-10-CM

## 2020-08-24 DIAGNOSIS — Z9889 Other specified postprocedural states: Secondary | ICD-10-CM

## 2020-08-25 NOTE — Progress Notes (Signed)
  Subjective:  Patient ID: Roger Johnson, male    DOB: [birth year],  MRN: [id number]  Chief Complaint  Patient presents with  . Routine Post Op    Pt stated that he is doing well denies any pain at this time.    DOS: 07/23/20 Procedure: left ankle peroneus brevis  Tendon reconstruction with tenodesis and allografting  60 y.o. male returns for post-op check. Doing well. Minimal pain  Review of Systems: Negative except as noted in the HPI. Denies N/V/F/Ch.   Objective:  There were no vitals filed for this visit. There is no height or weight on file to calculate BMI. Constitutional Well developed. Well nourished.  Vascular Foot warm and well perfused. Capillary refill normal to all digits.   Neurologic Normal speech. Oriented to person, place, and time. Epicritic sensation to light touch grossly present bilaterally.  Dermatologic Skin healing well without signs of infection. Skin edges well coapted without signs of infection.  Orthopedic: Tenderness to palpation noted about the surgical site.    Assessment:   1. Tear of peroneal tendon, left, initial encounter   2. Post-operative state    Plan:  Patient was evaluated and treated and all questions answered.  S/p foot surgery left -Progressing as expected post-operatively. -WB Status: May begin WB in the CAM boot with the 2 remaining plantarflexion wedges, remove the remainder today -Starting postop week 6 (Friday after next) he may begin ambulating short distances (e.g. from bed to bathroom) with the Ace wrap without the boot -Begin physical therapy and range of motion exercises, nonweightbearing exercises for now, may begin weightbearing exercises that postop week 6   No follow-ups on file.

## 2020-09-07 ENCOUNTER — Encounter: Payer: Self-pay | Admitting: Podiatry

## 2020-09-07 ENCOUNTER — Telehealth: Payer: Self-pay | Admitting: Podiatry

## 2020-09-07 NOTE — Telephone Encounter (Signed)
Patient called and stated that he is having some inflammation around the stitches and wanted to know if he could be seen today instead of Thursday.

## 2020-09-07 NOTE — Telephone Encounter (Signed)
Patient was just leaving PT, he is scheduled with you in the [place] location  tomorrow at 9:45

## 2020-09-07 NOTE — Telephone Encounter (Signed)
Yeah I can see him at 4:30. Thanks

## 2020-09-07 NOTE — Telephone Encounter (Signed)
Sounds good thank you

## 2020-09-08 ENCOUNTER — Other Ambulatory Visit: Payer: Self-pay

## 2020-09-08 ENCOUNTER — Ambulatory Visit (INDEPENDENT_AMBULATORY_CARE_PROVIDER_SITE_OTHER): Payer: Managed Care, Other (non HMO) | Admitting: Podiatry

## 2020-09-08 ENCOUNTER — Encounter: Payer: Self-pay | Admitting: Podiatry

## 2020-09-08 ENCOUNTER — Other Ambulatory Visit: Payer: Self-pay | Admitting: Podiatry

## 2020-09-08 DIAGNOSIS — L02612 Cutaneous abscess of left foot: Secondary | ICD-10-CM

## 2020-09-08 DIAGNOSIS — T8131XA Disruption of external operation (surgical) wound, not elsewhere classified, initial encounter: Secondary | ICD-10-CM

## 2020-09-08 DIAGNOSIS — L03032 Cellulitis of left toe: Secondary | ICD-10-CM

## 2020-09-08 MED ORDER — CEPHALEXIN 500 MG PO CAPS: 500.0000 mg | ORAL_CAPSULE | Freq: Three times a day (TID) | ORAL | 0 refills | Status: DC

## 2020-09-08 MED ORDER — MUPIROCIN 2 % EX OINT: 1.0000 "application " | TOPICAL_OINTMENT | Freq: Two times a day (BID) | CUTANEOUS | 2 refills | Status: DC

## 2020-09-08 NOTE — Telephone Encounter (Signed)
No problem.

## 2020-09-09 ENCOUNTER — Encounter: Payer: Managed Care, Other (non HMO) | Admitting: Podiatry

## 2020-09-12 ENCOUNTER — Encounter: Payer: Self-pay | Admitting: Podiatry

## 2020-09-12 MED ORDER — LEVOFLOXACIN 750 MG PO TABS: 750.0000 mg | ORAL_TABLET | Freq: Every day | ORAL | 0 refills | Status: AC

## 2020-09-12 NOTE — Addendum Note (Signed)
Addended bySherryle Lis, ADAM R on: 09/12/2020 09:39 AM   Modules accepted: Orders

## 2020-09-12 NOTE — Progress Notes (Signed)
  Subjective:  Patient ID: Roger Johnson, male    DOB: 13-Jun-1961,  MRN: [id number]  Chief Complaint  Patient presents with  . Post-op Problem    DOS: 07/23/20 Procedure: left ankle peroneus brevis  Tendon reconstruction with tenodesis and allografting  60 y.o. male returns for post-op check. Doing well.  Presents for urgent visit, he noticed an open area at the end of the incision  Review of Systems: Negative except as noted in the HPI. Denies N/V/F/Ch.   Objective:  There were no vitals filed for this visit. There is no height or weight on file to calculate BMI. Constitutional Well developed. Well nourished.  Vascular Foot warm and well perfused. Capillary refill normal to all digits.   Neurologic Normal speech. Oriented to person, place, and time. Epicritic sensation to light touch grossly present bilaterally.  Dermatologic  incision is well-healed with the exception of distal portion there is a small open area  Orthopedic: Tenderness to palpation noted about the surgical site.    Assessment:   1. Cellulitis and abscess of toe of left foot   2. Superficial disruption or dehiscence of operation wound, initial encounter    Plan:  Patient was evaluated and treated and all questions answered.  S/p foot surgery left -Discussed with him that I suspect this is likely a absorbable suture abscess that opened up -Wound culture was taken -Dress daily with mupirocin ointment which was sent to his pharmacy -Rx for Keflex was sent to his pharmacy -Gradually increase activity out of the boot into a lace up ankle brace which he has, expect to do this for about 4 weeks before resuming regular shoe gear without bracing -Continue physical therapy   Return in about 2 weeks (around 09/22/2020) for re-check incision.

## 2020-09-13 LAB — WOUND CULTURE

## 2020-09-14 ENCOUNTER — Encounter: Payer: Managed Care, Other (non HMO) | Admitting: Podiatry

## 2020-09-22 ENCOUNTER — Ambulatory Visit (INDEPENDENT_AMBULATORY_CARE_PROVIDER_SITE_OTHER): Payer: Managed Care, Other (non HMO) | Admitting: Podiatry

## 2020-09-22 ENCOUNTER — Other Ambulatory Visit: Payer: Self-pay

## 2020-09-22 ENCOUNTER — Encounter: Payer: Self-pay | Admitting: Podiatry

## 2020-09-22 DIAGNOSIS — S86312A Strain of muscle(s) and tendon(s) of peroneal muscle group at lower leg level, left leg, initial encounter: Secondary | ICD-10-CM

## 2020-09-22 DIAGNOSIS — Z9889 Other specified postprocedural states: Secondary | ICD-10-CM

## 2020-09-22 DIAGNOSIS — T8131XA Disruption of external operation (surgical) wound, not elsewhere classified, initial encounter: Secondary | ICD-10-CM

## 2020-09-22 NOTE — Progress Notes (Signed)
  Subjective:  Patient ID: Roger Johnson, male    DOB: [birth year],  MRN: [id number]  Chief Complaint  Patient presents with  . Routine Post Op    "its looking much better and physical therapy has really helped"    DOS: 07/23/20 Procedure: left ankle peroneus brevis  Tendon reconstruction with tenodesis and allografting  60 y.o. male returns for post-op check. Doing well.  He is almost finished with his antibiotics  Review of Systems: Negative except as noted in the HPI. Denies N/V/F/Ch.   Objective:  There were no vitals filed for this visit. There is no height or weight on file to calculate BMI. Constitutional Well developed. Well nourished.  Vascular Foot warm and well perfused. Capillary refill normal to all digits.   Neurologic Normal speech. Oriented to person, place, and time. Epicritic sensation to light touch grossly present bilaterally.  Dermatologic  previous suture abscess has healed completely  Orthopedic:  He has now tenderness to palpation noted about the surgical site.  Strength is 5 out of 5 in all planes    Result Notes  Component 2 wk ago  Gram Stain Result Final report   Organism ID, Bacteria Comment   Comment: Rare white blood cells.  Organism ID, Bacteria Comment   Comment: Few gram negative rods.  Aerobic Bacterial Culture Final reportAbnormal   Organism ID, Bacteria Escherichia coliAbnormal   Comment: Heavy growth  Antimicrobial Susceptibility Comment   Comment:   ** S = Susceptible; I = Intermediate; R = Resistant **           P = Positive; N = Negative        MICS are expressed in micrograms per mL   Antibiotic         RSLT#1  RSLT#2  RSLT#3  RSLT#4  Amoxicillin/Clavulanic Acid  S  Ampicillin           S  Cefepime            S  Ceftriaxone          S  Cefuroxime           S  Ciprofloxacin         S  Ertapenem           S  Gentamicin            S  Imipenem            S  Levofloxacin          S  Meropenem           S  Piperacillin/Tazobactam    S  Tetracycline          S  Tobramycin           S  Trimethoprim/Sulfa       S       Assessment:   1. Superficial disruption or dehiscence of operation wound, initial encounter   2. Tear of peroneal tendon, left, initial encounter   3. Post-operative state    Plan:  Patient was evaluated and treated and all questions answered.  S/p foot surgery left -Doing quite well, suture abscess has healed -Continue increasing activity, using a brace for longer periods and strenuous activity for the next 2 weeks then regular shoe gear without bracing -Continue physical therapy with 4 weeks -At next visit plan to release back to full activity  Return in about 2 months (around 11/22/2020).

## 2020-11-24 ENCOUNTER — Ambulatory Visit: Payer: Managed Care, Other (non HMO) | Admitting: Podiatry

## 2020-12-01 ENCOUNTER — Ambulatory Visit: Payer: Managed Care, Other (non HMO) | Admitting: Podiatry

## 2020-12-08 ENCOUNTER — Encounter: Payer: Self-pay | Admitting: Podiatry

## 2020-12-08 ENCOUNTER — Ambulatory Visit (INDEPENDENT_AMBULATORY_CARE_PROVIDER_SITE_OTHER): Payer: Managed Care, Other (non HMO) | Admitting: Podiatry

## 2020-12-08 ENCOUNTER — Other Ambulatory Visit: Payer: Self-pay

## 2020-12-08 DIAGNOSIS — T8131XA Disruption of external operation (surgical) wound, not elsewhere classified, initial encounter: Secondary | ICD-10-CM

## 2020-12-08 DIAGNOSIS — S86312A Strain of muscle(s) and tendon(s) of peroneal muscle group at lower leg level, left leg, initial encounter: Secondary | ICD-10-CM | POA: Diagnosis not present

## 2020-12-08 NOTE — Progress Notes (Signed)
Subjective:  Patient ID: Roger Johnson, male    DOB: February 06, 1961,  MRN: [id number]  Chief Complaint  Patient presents with   Wound Check    "Its much better.  Physical therapy helped.  I still have swelling on the side of my ankle without the compression sleeve"    DOS: 07/23/20 Procedure: left ankle peroneus brevis  Tendon reconstruction with tenodesis and allografting  60 y.o. male returns for post-op check. Doing well.  Having some swelling but no pain his strength is getting better with working with physical therapist and personal trainer  Review of Systems: Negative except as noted in the HPI. Denies N/V/F/Ch.   Objective:  There were no vitals filed for this visit. There is no height or weight on file to calculate BMI. Constitutional Well developed. Well nourished.  Vascular Foot warm and well perfused. Capillary refill normal to all digits.   Neurologic Normal speech. Oriented to person, place, and time. Epicritic sensation to light touch grossly present bilaterally.  Dermatologic Incision is well-healed and not hypertrophic  Orthopedic:  He has no tenderness to palpation noted about the surgical site.  Strength is 5 out of 5 in all planes.  Minimal edema   Result Notes  Component 2 wk ago  Gram Stain Result Final report   Organism ID, Bacteria Comment   Comment: Rare white blood cells.  Organism ID, Bacteria Comment   Comment: Few gram negative rods.  Aerobic Bacterial Culture Final report Abnormal    Organism ID, Bacteria Escherichia coli Abnormal    Comment: Heavy growth  Antimicrobial Susceptibility Comment   Comment:       ** S = Susceptible; I = Intermediate; R = Resistant **                     P = Positive; N = Negative              MICS are expressed in micrograms per mL     Antibiotic                 RSLT#1    RSLT#2    RSLT#3    RSLT#4  Amoxicillin/Clavulanic Acid    S  Ampicillin                     S  Cefepime                       S  Ceftriaxone                     S  Cefuroxime                     S  Ciprofloxacin                  S  Ertapenem                      S  Gentamicin                     S  Imipenem                       S  Levofloxacin                   S  Meropenem  S  Piperacillin/Tazobactam        S  Tetracycline                   S  Tobramycin                     S  Trimethoprim/Sulfa             S       Assessment:   1. Superficial disruption or dehiscence of operation wound, initial encounter   2. Tear of peroneal tendon, left, initial encounter     Plan:  Patient was evaluated and treated and all questions answered.  S/p foot surgery left -Overall is doing very well.  At this point he can resume his normal athletic activities.  Has compression sleeve for edema and lace up ankle brace as he resumes tennis and pickleball.  Likely persistent edema for a few more months as it completely heals and remodels.  Return to see me as needed at this point  Return if symptoms worsen or fail to improve.

## 2021-05-11 ENCOUNTER — Other Ambulatory Visit: Payer: Self-pay

## 2021-05-11 ENCOUNTER — Ambulatory Visit (INDEPENDENT_AMBULATORY_CARE_PROVIDER_SITE_OTHER): Payer: Managed Care, Other (non HMO)

## 2021-05-11 ENCOUNTER — Ambulatory Visit: Payer: Managed Care, Other (non HMO) | Admitting: Podiatry

## 2021-05-11 DIAGNOSIS — S99912A Unspecified injury of left ankle, initial encounter: Secondary | ICD-10-CM | POA: Diagnosis not present

## 2021-05-11 DIAGNOSIS — S99922A Unspecified injury of left foot, initial encounter: Secondary | ICD-10-CM

## 2021-05-11 DIAGNOSIS — S93422A Sprain of deltoid ligament of left ankle, initial encounter: Secondary | ICD-10-CM | POA: Diagnosis not present

## 2021-05-11 MED ORDER — MELOXICAM 15 MG PO TABS: 15.0000 mg | ORAL_TABLET | Freq: Every day | ORAL | 3 refills | Status: DC

## 2021-05-11 NOTE — Patient Instructions (Signed)
 Chronic Ankle Instability & Rehab Chronic Ankle Instability Chronic ankle instability is a condition that makes the ankle weak and more likely to give way. The condition is common among athletes, especially those with prior ankle ligament injury. Ligaments are strong tissues that connectbones to each other. What are the causes?  This condition is caused by multiple ankle sprains that have not healedproperly, leaving the ankle ligaments loose or damaged. What increases the risk? This condition is more likely to develop in people who participate in sports in which there is a risk of spraining an ankle. These sports include: Cross-country trail running. Basketball. Baseball. Tennis. Football. Soccer. What are the signs or symptoms? Symptoms of this condition include: Rolling your ankle repeatedly. Swelling. Pain. Bruising. Tenderness. Feeling wobbly or unsteady on your foot. Difficulty walking on uneven surfaces or in the dark. How is this diagnosed? This condition may be diagnosed based on: Your symptoms. Your medical history. A physical exam. Your health care provider will check your balance, strength, and range of motion. He or she will also check your injured ankle against your healthy ankle. Imaging tests, such as: An X-ray. A CT scan. An MRI. An ultrasound. How is this treated? Treatment for this condition may include: Wearing a removable boot, brace, or splint. Wearing supportive shoes or shoe inserts. Applying ice to the ankle to reduce swelling. Taking anti-inflammatory pain medicine. Doing exercises (physical therapy). Not putting any body weight, or putting only limited body weight, on your ankle for several days. Gradually returning to full activity. Surgery to repair damaged ligaments. Usually, surgery is needed only if the condition is severe or if othertreatments do not work. Follow these instructions at home: If you have a boot, brace, or splint: Wear it  as told by your health care provider. Remove it only as told by your health care provider. Loosen it if your toes tingle, become numb, or turn cold and blue. Keep it clean. If it is not waterproof: Do not let it get wet. Cover it with a watertight covering when you take a bath or a shower. Ask your health care provider when it is safe to drive with a boot, brace, or splint on your foot. Managing pain, stiffness, and swelling  If directed, put ice on the injured area. If you have a removable boot, brace, or splint, remove it as told by your health care provider. Put ice in a plastic bag. Place a towel between your skin and the bag. Leave the ice on for 20 minutes, 2-3 times a day. Move your toes, foot, and ankle often to reduce stiffness and swelling. Raise (elevate) the injured area above the level of your heart while you are sitting or lying down.  Activity Return to your normal activities as told by your health care provider. Ask your health care provider what activities are safe for you. Do not put your full body weight on your ankle until your health care provider says that you can. Do not do any activities that make pain or swelling worse. Do exercises as told by your health care provider. General instructions Take over-the-counter and prescription medicines only as told by your health care provider. Wear supportive shoes or inserts as told by your health care provider. Keep all follow-up visits as told by your health care provider. This is important. How is this prevented? Wear supportive footwear that is appropriate for your athletic activity. Avoid athletic activities that cause pain or swelling in your ankle. See your health   care provider if you have an ankle sprain that causes pain and swelling for more than 2-4 weeks. Do ankle range-of-motion and strengthening exercises as told by your health care provider before beginning any athletic activity. If you start a new athletic  activity, start gradually to build up your strength and flexibility. Contact a health care provider if: Your condition is not getting better after 2-4 weeks of treatment. You cannot put body weight on your ankle without feeling more pain. Summary Chronic ankle instability is a condition that makes the ankle weak and more likely to give way. This condition is caused by multiple ankle sprains that have not healed properly, leaving the ankle ligaments loose or damaged. Treatment includes wearing a boot, brace, or splint, taking medicines for pain and inflammation, and using ice on the affected area. Follow your health care provider's instructions for caring for your ankle during recovery. Contact your health care provider if your ankle does not get better in 2-4 weeks, or if you cannot put weight on your ankle without feeling more pain. This information is not intended to replace advice given to you by your health care provider. Make sure you discuss any questions you have with your healthcare provider. Document Revised: 03/26/2018 Document Reviewed: 03/26/2018 Elsevier Patient Education  2022 [employer] Inc.    Ask your health care provider which exercises are safe for you. Do exercises exactly as told by your health care provider and adjust them as directed. It is normal to feel mild stretching, pulling, tightness, or discomfort as you do these exercises. Stop right away if you feel sudden pain or your pain gets worse. Do not begin these exercises until told by your health care provider. Strengthening exercises These exercises build strength and endurance in your ankle. Endurance is theability to use your muscles for a long time, even after they get tired. Ankle eversion Sit on the floor with your legs straight out in front of you. Loop a rubber exercise band around the ball of your left / right foot. The ball of your foot is on the walking surface, right under your toes. Hold the ends of the band  in your hands, or secure the band to a stable object. Slowly push your foot outward, away from your other leg (eversion). Hold this position for 10 seconds. Slowly return your foot to the starting position. Repeat 10 times. Complete this exercise 2 times a day. Heel walking  This exercise strengthens the muscles that push the ankle backward to point your toes toward your knee (ankle dorsiflexors). Walk on your heels for 10 ft. Keep your toes as high as possible. Repeat 10 times. Complete this exercise 2  times per day. Toe walking  This exercise strengthens the muscles that push the ankle forward and your toes downward (ankle plantar flexors). Walk on your toes for 10 ft. Keep your heels as high as possible. Repeat 10  times. Complete this exercise 2  times per day. Balance exercises These exercises improve or maintain your balance. Balance is important inimproving ankle stability and preventing falls. Tandem walking Do this exercise in a hallway or room that is at least 10 ft (3 m) long. Stand with one foot directly in front of the other (tandem). You can use the walls to help you balance if needed, but try not to use them for support. Slowly lift your back foot and place it directly in front of your other foot. Continue to walk in this heel-to-toe way for 10   ft. Repeat 10  times. Complete this exercise 2  times a day. Single leg stand Without shoes, stand near a railing or in a doorway. You may hold on to the railing or door frame as needed for balance. Stand on your left / right foot. Keep your big toe down on the floor and try to keep your arch lifted. If this is too easy, you can try doing it while you do one of these actions: Keep your eyes closed. Stand on a pillow. Throw a ball against a wall and catch it when it returns. Hold this position for 10 seconds. Repeat 10 times. Complete this exercise 2 times a day. Ankle inversion and ankle eversion This exercise is also called  foot rotation with a balance board. It uses a balance board to rotate the foot and ankle inward (inversion) and outward (eversion). Ask your health care provider where you can get a balance board or how you can make one. Stand on a non-carpeted surface near a countertop or wall. Step onto the balance board so your feet are hip width apart. Keep your feet in place, and keep your upper body and hips steady. Using only your feet and ankles to move the board, do the following exercises: Tip the board from side to side as far as you can, alternating between tipping to the left and tipping to the right. Tip the board so it silently taps the floor. Do not let the board forcefully hit the floor. From time to time, pause to hold a steady midway position, with neither the right nor the left sides touching the ground. Tip the board side to side so the board does not hit the floor at all. From time to time, pause to hold a steady midway position. Repeat 10 times, pausing from time to time to hold a steady position.Complete this exercise 2 times a day. Ankle plantar flexion and ankle dorsiflexion This exercise is also called foot flexion with a balance board. It uses a balance board to push the foot downward and away from the leg (plantar flexion) or upward and toward the leg (dorsiflexion). Ask your health care provider where you can get a balance board or how you can make one. Stand on a non-carpeted surface near a countertop or wall. Step onto the balance board so your feet are hip width apart. Keep your feet in place, and keep your upper body and hips steady. Using only your feet and ankles, do the following exercises: Tip the board forward and backward so the board silently taps the floor. Do not let the board forcefully hit the floor. From time to time, pause to hold a steady position midway between touching the floor in front and touching the floor in back. Tip the board forward and backward so the board  does not hit the floor at all. From time to time, pause to hold a steady position. Repeat 10 times, pausing from time to time to hold a steady position.Complete this exercise 2 times a day. This information is not intended to replace advice given to you by your health care provider. Make sure you discuss any questions you have with your healthcare provider. Document Revised: 10/03/2018 Document Reviewed: 03/26/2018 Elsevier Patient Education  2022 Elsevier Inc.  

## 2021-05-12 NOTE — Progress Notes (Signed)
  Subjective:  Patient ID: Roger Johnson, male    DOB: 05-Nov-1960,  MRN: [id number]  Chief Complaint  Patient presents with   Tendonitis    left foot pain-1 year follow up    60 y.o. male presents with the above complaint. History confirmed with patient.  Returns for follow-up from surgery 07/23/2020 for peroneal tendon repair with reconstruction.  Still having some swelling there but overall doing very well.  About a month ago he rolled his ankle and sprained it.  Most the pain is on the medial side of the ankle  Objective:  Physical Exam: warm, good capillary refill, no trophic changes or ulcerative lesions, normal DP and PT pulses, and normal sensory exam. Left Foot: Good strength on the peroneals no pain to palpation.  Mild edema here, he has pain over the superficial deltoid, none over the TA tendon, no gross instability or strength deficits    Radiographs: Multiple views x-ray of left ankle: no fracture, dislocation, swelling or degenerative changes noted Assessment:   1. Injury of left ankle and foot, initial encounter   2. Sprain of deltoid ligament of left ankle, initial encounter      Plan:  Patient was evaluated and treated and all questions answered.  Suspect he has a strain of the superficial deltoid.  I recommended support with his lace up ankle brace for a few weeks and begin working on stability exercises which I gave him.  He will let me know how he is doing in about a month and if not improving would consider MRI and PT  No follow-ups on file.

## 2021-05-25 ENCOUNTER — Ambulatory Visit: Payer: Managed Care, Other (non HMO) | Admitting: Podiatry

## 2021-06-02 ENCOUNTER — Encounter: Payer: Self-pay | Admitting: Podiatry

## 2021-07-19 ENCOUNTER — Encounter: Payer: Self-pay | Admitting: Podiatry

## 2021-07-28 ENCOUNTER — Other Ambulatory Visit: Payer: Self-pay | Admitting: *Deleted

## 2021-07-28 DIAGNOSIS — S99922A Unspecified injury of left foot, initial encounter: Secondary | ICD-10-CM

## 2021-07-28 DIAGNOSIS — S99912A Unspecified injury of left ankle, initial encounter: Secondary | ICD-10-CM

## 2021-07-28 DIAGNOSIS — S93422A Sprain of deltoid ligament of left ankle, initial encounter: Secondary | ICD-10-CM

## 2021-08-05 ENCOUNTER — Ambulatory Visit
Admission: RE | Admit: 2021-08-05 | Discharge: 2021-08-05 | Disposition: A | Discharge status: Home / Self Care | Payer: No Typology Code available for payment source | Source: Ambulatory Visit | Attending: Podiatry | Admitting: Podiatry

## 2021-08-05 ENCOUNTER — Telehealth: Payer: Self-pay | Admitting: Podiatry

## 2021-08-05 DIAGNOSIS — S99912A Unspecified injury of left ankle, initial encounter: Secondary | ICD-10-CM

## 2021-08-05 DIAGNOSIS — S93422A Sprain of deltoid ligament of left ankle, initial encounter: Secondary | ICD-10-CM

## 2021-08-05 NOTE — Telephone Encounter (Signed)
Christella Scheuermann called and stated that the patient has a MRI scheduled for today that did not get an [place]. Patient would like schedule a peer to peer asap because he does not want to cx his appointment. Please call the clinical team asap at  [phone]

## 2021-08-09 NOTE — Telephone Encounter (Signed)
Dr. Sherryle Lis please assist. Wonda Olds- can you schedule follow up if Dr. Sherryle Lis needs? Thanks!

## 2021-08-09 NOTE — Telephone Encounter (Signed)
Peer to peer conversation was completed today with Dr. Nyoka Cowden from [place] authorization [id number] expiration 01/28/2022

## 2021-08-16 ENCOUNTER — Ambulatory Visit: Payer: Managed Care, Other (non HMO) | Admitting: Podiatry

## 2021-08-16 ENCOUNTER — Other Ambulatory Visit: Payer: Self-pay

## 2021-08-16 DIAGNOSIS — M19079 Primary osteoarthritis, unspecified ankle and foot: Secondary | ICD-10-CM | POA: Diagnosis not present

## 2021-08-16 DIAGNOSIS — R937 Abnormal findings on diagnostic imaging of other parts of musculoskeletal system: Secondary | ICD-10-CM

## 2021-08-16 DIAGNOSIS — M25572 Pain in left ankle and joints of left foot: Secondary | ICD-10-CM

## 2021-08-22 NOTE — Progress Notes (Signed)
°  Subjective:  Patient ID: Roger Johnson, male    DOB: 10-22-1960,  MRN: [id number]  Chief Complaint  Patient presents with   Tendonitis    Follow up MRI results left foot and ankle    61 y.o. male presents with the above complaint. History confirmed with patient.  He completed the MRI and is here for review of the results as well as further treatment options.  Outside of the ankle feels fine.  Most the pain is centered around the anterior ankle and dorsal midfoot  Objective:  Physical Exam: warm, good capillary refill, no trophic changes or ulcerative lesions, normal DP and PT pulses, and normal sensory exam. Left Foot: 5 out of 5 strength with resisted inversion and eversion, no pain along the peroneal tendons.  Minimal tenderness over the calcaneocuboid joint.  He has pain on palpation of the sinus tarsi and around the talonavicular joint.   IMPRESSION: Since the prior examination, the patient has developed intense edema in the head and neck of the talus and a moderate degree of edema throughout the navicular. Findings could be due to stress change or rapidly progressive osteoarthritis.   Age advanced osteoarthritis calcaneocuboid joint as seen on the prior examination. Marrow edema about the joint has improved since the prior study.   Negative for tendon or ligament tear. The patient is status post peroneus brevis repair.   Partial obliteration of fat within the sinus tarsi worrisome for sinus tarsus syndrome.     Electronically Signed   By: Inge Rise M.D.   On: 08/07/2021 09:08 Assessment:   1. Bone marrow edema   2. 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  I reviewed the results of the MRI and treatment options with Roger Johnson in detail.  We discussed the presence of the bone marrow edema.  Currently I think this is likely reactive inflammatory  changes second to the developing osteoarthritis of the hindfoot, he already has severe osteoarthritis of the calcaneocuboid joint and this is likely progressing to the adjacent joints.  I recommended rest and offloading, he will use the boot and knee scooter for 1 month.  I discussed the possibility of progression of edema and inflammation into osteonecrosis and currently there is no clinical evidence of this or on his MRI.  We discussed the use of anti-inflammatories and direct corticosteroid injection.  Following sterile prep with Betadine 0.5 cc each of 2% lidocaine plain, 0.5% Marcaine plain, 5 mg of Kenalog and 2 mg of dexamethasone phosphate was injected in the sinus tarsi through direct approach.  He tolerated this well.  I will see him back in 2 months, he may begin weightbearing in the boot in 1 month.  New x-rays at next visit  Return in about 2 months (around 10/14/2021) for follow-up, x-rays of ankle.

## 2021-09-14 ENCOUNTER — Encounter: Payer: Self-pay | Admitting: Podiatry

## 2021-09-19 MED ORDER — MELOXICAM 15 MG PO TABS: 15.0000 mg | ORAL_TABLET | Freq: Every day | ORAL | 3 refills | Status: DC

## 2021-10-26 ENCOUNTER — Ambulatory Visit: Payer: Managed Care, Other (non HMO) | Admitting: Podiatry

## 2021-11-02 ENCOUNTER — Ambulatory Visit: Payer: Managed Care, Other (non HMO) | Admitting: Podiatry

## 2021-11-02 ENCOUNTER — Encounter: Payer: Self-pay | Admitting: Podiatry

## 2021-11-02 DIAGNOSIS — M19079 Primary osteoarthritis, unspecified ankle and foot: Secondary | ICD-10-CM | POA: Diagnosis not present

## 2021-11-02 DIAGNOSIS — M25572 Pain in left ankle and joints of left foot: Secondary | ICD-10-CM | POA: Diagnosis not present

## 2021-11-02 DIAGNOSIS — R937 Abnormal findings on diagnostic imaging of other parts of musculoskeletal system: Secondary | ICD-10-CM

## 2021-11-02 NOTE — Progress Notes (Signed)
?  Subjective:  ?Patient ID: Roger Johnson, male    DOB: 1961/01/08,  MRN: [id number] ? ?Chief Complaint  ?Patient presents with  ? Ankle Injury  ?   Doctor visit about the MRI recently done, schedule for a steroid shot.  ? ? ?61 y.o. male presents with the above complaint. History confirmed with patient.  He returns today for follow-up he is back in regular shoe gear has not started his tennis or pickle ball yet.  He has had quite a bit of improvement and is having little to no day-to-day pain. ? ?Objective:  ?Physical Exam: ?warm, good capillary refill, no trophic changes or ulcerative lesions, normal DP and PT pulses, and normal sensory exam. ?Left Foot: 5 out of 5 strength with resisted inversion and eversion, no pain along the peroneal tendons.  He has no tenderness over the calcaneocuboid joint.  No pain in sinus tarsi, slight edema over the talonavicular joint ? ? ?IMPRESSION: ?Since the prior examination, the patient has developed intense edema ?in the head and neck of the talus and a moderate degree of edema ?throughout the navicular. Findings could be due to stress change or ?rapidly progressive osteoarthritis. ?  ?Age advanced osteoarthritis calcaneocuboid joint as seen on the ?prior examination. Marrow edema about the joint has improved since ?the prior study. ?  ?Negative for tendon or ligament tear. The patient is status post ?peroneus brevis repair. ?  ?Partial obliteration of fat within the sinus tarsi worrisome for ?sinus tarsus syndrome. ?  ?  ?Electronically Signed ?  By: Inge Rise M.D. ?  On: 08/07/2021 09:08 ?Assessment:  ? ?1. Osteoarthritis of calcaneocuboid joint   ?2. 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 ? ? ?Plan:  ?Patient was evaluated and treated and all questions answered. ? ?Has improved quite a bit with rest immobilization and meloxicam.  We discussed again that I think is likely secondary to his calcaneal cuboid joint arthritis and this eventually  likely will require surgery.  At this point with his activity level I would try to manage symptomatically and hold off on surgery for another 10 to 15 years if possible.  His peroneal repair remains asymptomatic and functions well.  I think he can return to his sport activity and advised to wear a supportive ankle brace and compression stockings and use the meloxicam as needed.  He did request a referral to rheumatology for the arthritis as well and I think this is reasonable and a referral was sent to Dr. Amil Amen who his wife has seen before and recommended.  We also discussed symptomatic management with corticosteroid injections and he will probably return in about 1 month for injection I discussed with him we could do 3-4 of these a year. ? ?No follow-ups on file.  ? ?

## 2021-11-08 ENCOUNTER — Encounter: Payer: Self-pay | Admitting: Podiatry

## 2021-11-28 ENCOUNTER — Telehealth: Payer: Self-pay | Admitting: *Deleted

## 2021-11-28 NOTE — Telephone Encounter (Signed)
Patient left message - hasn't heard about an appt for the referral you were sending out and its been 3 weeks. Please call back.

## 2021-11-29 ENCOUNTER — Telehealth: Payer: Self-pay | Admitting: *Deleted

## 2021-11-29 NOTE — Telephone Encounter (Signed)
I messaged him this morning about it.  Ammie I sent you a separate message about this, can you confirm that Dr. Amil Amen office has a referral?  If they do not have it please resend and confirm with him again that they have received it and can process it.  We sent it twice now and they have told him twice that they have not received it so I want to make sure that we are actually getting it to the right fax number and that they are able to schedule him

## 2021-11-29 NOTE — Telephone Encounter (Signed)
Confirmed their fax # before sending.

## 2021-11-29 NOTE — Telephone Encounter (Signed)
Faxed/called  referral to [facility] received 11/29/21. Spoke with patient and he is ok with scheduling with new doctor(Dr Christinia Gully), openings in August.

## 2021-11-29 NOTE — Telephone Encounter (Signed)
Faxed referral to [facility] with patient, ok with seeing a new physician

## 2021-11-29 NOTE — Telephone Encounter (Signed)
Resent referral and called [facility]

## 2021-12-07 ENCOUNTER — Ambulatory Visit: Payer: Managed Care, Other (non HMO) | Admitting: Podiatry

## 2021-12-07 DIAGNOSIS — M25572 Pain in left ankle and joints of left foot: Secondary | ICD-10-CM | POA: Diagnosis not present

## 2021-12-07 NOTE — Addendum Note (Signed)
Addended bySherryle Lis, Najib Colmenares R on: 12/07/2021 03:12 PM   Modules accepted: Orders

## 2021-12-10 ENCOUNTER — Encounter: Payer: Self-pay | Admitting: Podiatry

## 2021-12-10 NOTE — Progress Notes (Signed)
  Subjective:  Patient ID: Roger Johnson, male    DOB: [birth year],  MRN: [id number]  Chief Complaint  Patient presents with   Arthritis    left foot injection    61 y.o. male presents with the above complaint. History confirmed with patient.  He returns today for follow-up the injections have been helpful and he feels like it is under control now, beginning to have some increasing tenderness again  Objective:  Physical Exam: warm, good capillary refill, no trophic changes or ulcerative lesions, normal DP and PT pulses, and normal sensory exam. Left Foot: 5 out of 5 strength with resisted inversion and eversion, no pain along the peroneal tendons.  Minimal tenderness over the calcaneocuboid joint.  He has pain on palpation of the sinus tarsi and around the talonavicular joint.   IMPRESSION: Since the prior examination, the patient has developed intense edema in the head and neck of the talus and a moderate degree of edema throughout the navicular. Findings could be due to stress change or rapidly progressive osteoarthritis.   Age advanced osteoarthritis calcaneocuboid joint as seen on the prior examination. Marrow edema about the joint has improved since the prior study.   Negative for tendon or ligament tear. The patient is status post peroneus brevis repair.   Partial obliteration of fat within the sinus tarsi worrisome for sinus tarsus syndrome.     Electronically Signed   By: Inge Rise M.D.   On: 08/07/2021 09:08 Assessment:   1. Sinus tarsi syndrome of left ankle      Plan:  Patient was evaluated and treated and all questions answered.  Today we again discussed his course and continued nonoperative treatment.  He has done well with injection therapy and a repeat injection was performed today.  Direct corticosteroid injection.  Following sterile prep with Betadine 0.5 cc each of 2% lidocaine plain, 0.5% Marcaine plain, 5 mg of Kenalog and 2 mg of dexamethasone  phosphate was injected in the sinus tarsi through direct approach.  He tolerated this well.  Hopefully can get 4 to 6 months of relief from this 1.  I will see him back when he is in need of further treatment  We also discussed medical treatment for this as well and I recommended Celebrex 20 mg twice daily.  He we discussed the use and possible side effects.  Return if symptoms worsen or fail to improve.

## 2021-12-12 ENCOUNTER — Encounter: Payer: Self-pay | Admitting: Podiatry

## 2021-12-12 MED ORDER — CELECOXIB 100 MG PO CAPS: 100.0000 mg | ORAL_CAPSULE | Freq: Two times a day (BID) | ORAL | 2 refills | Status: DC

## 2021-12-12 NOTE — Addendum Note (Signed)
Addended bySherryle Lis, Quanell Loughney R on: 12/12/2021 08:31 AM   Modules accepted: Orders

## 2021-12-12 NOTE — Telephone Encounter (Signed)
Please advise 

## 2021-12-14 ENCOUNTER — Ambulatory Visit: Payer: Managed Care, Other (non HMO) | Admitting: Podiatry

## 2021-12-16 ENCOUNTER — Encounter: Payer: Self-pay | Admitting: Podiatry

## 2022-01-10 ENCOUNTER — Telehealth: Payer: Self-pay | Admitting: *Deleted

## 2022-01-10 NOTE — Telephone Encounter (Signed)
Patient has been scheduled for [facility] clinic '@1'$ :00-07/19/23.

## 2022-01-10 NOTE — Telephone Encounter (Signed)
-----   Message from Criselda Peaches, DPM sent at 12/20/2021  7:27 AM EDT ----- We re-sent his rheum referral to [facility] in [place]. Nira Conn confirmed they received it the day it was faxed. Can you check with them to see when they will contact him? He has not heard anything

## 2022-04-06 ENCOUNTER — Encounter: Payer: Self-pay | Admitting: Podiatry

## 2022-04-06 MED ORDER — CELECOXIB 200 MG PO CAPS: 200.0000 mg | ORAL_CAPSULE | Freq: Two times a day (BID) | ORAL | 3 refills | Status: AC

## 2022-05-08 ENCOUNTER — Ambulatory Visit: Payer: BC Managed Care – PPO | Admitting: Podiatry

## 2022-05-08 DIAGNOSIS — M19072 Primary osteoarthritis, left ankle and foot: Secondary | ICD-10-CM

## 2022-05-08 NOTE — Progress Notes (Signed)
  Subjective:  Patient ID: Roger Johnson, male    DOB: 1960/08/31,  MRN: [id number]  Chief Complaint  Patient presents with   Tendonitis    Left ankle flare up, patient thinks he might need an injection    61 y.o. male presents with the above complaint. History confirmed with patient.  He returns today for follow-up the last injection did very well until about a month ago he is still using his Celebrex and this has been helpful as well.  He did follow-up with rheumatology, no inflammatory arthritis was found he did note that this is all likely osteoarthritis and degenerative  Objective:  Physical Exam: warm, good capillary refill, no trophic changes or ulcerative lesions, normal DP and PT pulses, and normal sensory exam. Left Foot: 5 out of 5 strength with resisted inversion and eversion, no pain along the peroneal tendons.  Minimal tenderness over the calcaneocuboid joint.  He has pain on palpation today on the medial talonavicular joint  IMPRESSION: Since the prior examination, the patient has developed intense edema in the head and neck of the talus and a moderate degree of edema throughout the navicular. Findings could be due to stress change or rapidly progressive osteoarthritis.   Age advanced osteoarthritis calcaneocuboid joint as seen on the prior examination. Marrow edema about the joint has improved since the prior study.   Negative for tendon or ligament tear. The patient is status post peroneus brevis repair.   Partial obliteration of fat within the sinus tarsi worrisome for sinus tarsus syndrome.     Electronically Signed   By: Inge Rise M.D.   On: 08/07/2021 09:08 Assessment:   1. Osteoarthritis of left ankle and foot      Plan:  Patient was evaluated and treated and all questions answered.  Today we again discussed his course and continued nonoperative treatment.  He has done well with injection therapy and a repeat injection was performed today.   Direct corticosteroid injection of the talonavicular joint was performed.  Following sterile prep with Betadine 0.5 cc each of 2% lidocaine plain, 0.5% Marcaine plain, 5 mg of Kenalog and 2 mg of dexamethasone phosphate was injected in the talonavicular joint from a dorsal medial approach.  Care was taken to avoid the TA tendon sheath.  I did recommend he rest for the next 2 weeks following the injection.  Continue Celebrex as needed.  I will see him back as needed  Return if symptoms worsen or fail to improve.

## 2022-05-22 ENCOUNTER — Other Ambulatory Visit: Payer: Self-pay | Admitting: Podiatry

## 2022-07-11 IMAGING — MR MR ANKLE*L* W/O CM
4 of 7 series · 11 of 40 positions shown · non-contrast
Comparison: MRI left ankle 03/29/2020. Plain films left ankle
05/11/2021.

CLINICAL DATA: Left ankle and foot pain and instability since the
patient tripped on a rock 6 months ago. The patient is status post
peroneal tendon repair 07/23/2020.

EXAM:
MRI OF THE LEFT ANKLE WITHOUT CONTRAST
TECHNIQUE: Multiplanar, multisequence MR imaging of the ankle was performed. No
intravenous contrast was administered.

[Series 3: PD fat-sat · axial · left · 3.0mm · 0.25mm/px · z∈[-35,+60]mm · 3 of 37 slices shown]
[im 7/37]
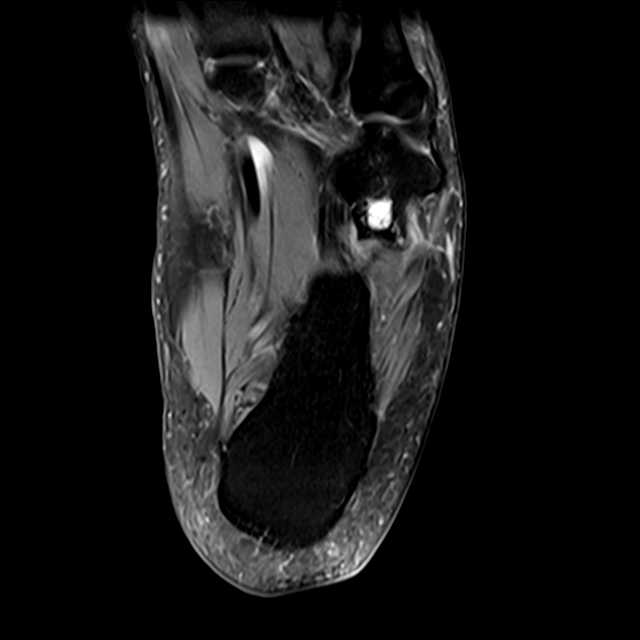
[im 19/37]
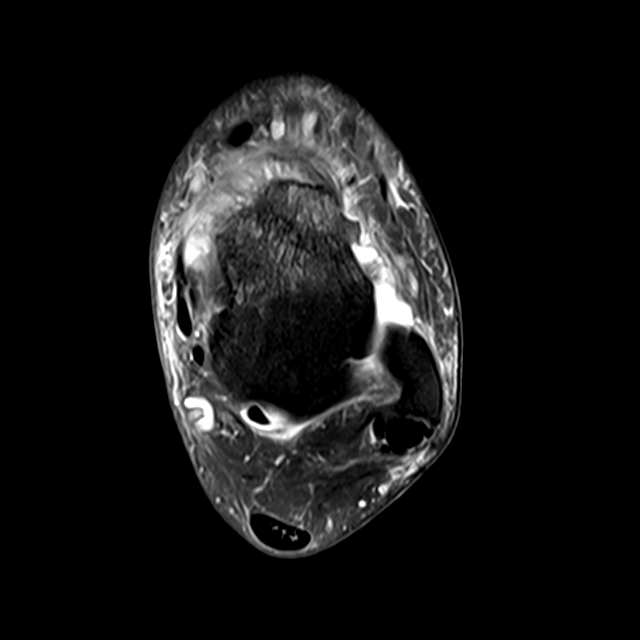
[im 31/37]
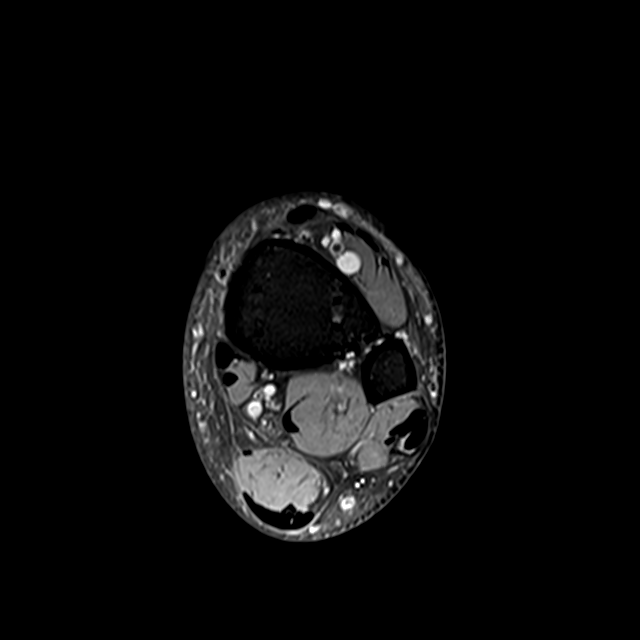

[Series 4: T2 fat-sat · axial · left · 3.0mm · 0.25mm/px · z∈[-35,+60]mm · 3 of 37 slices shown (1 of 3)]
[im 7/37]
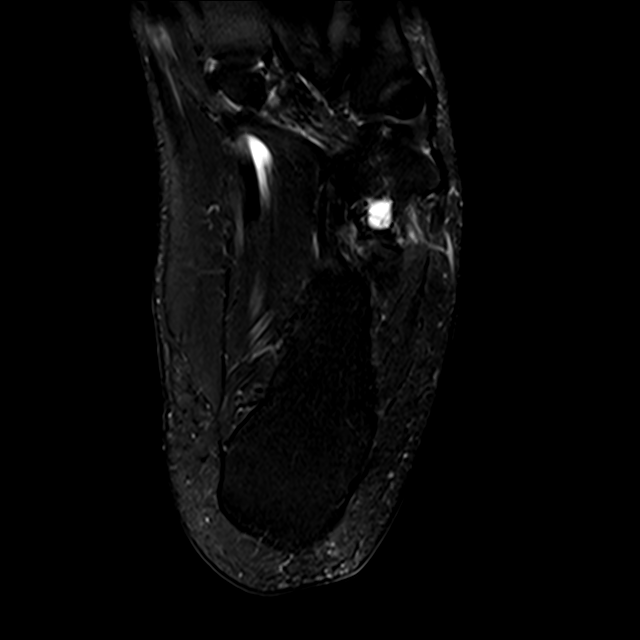
[im 19/37]
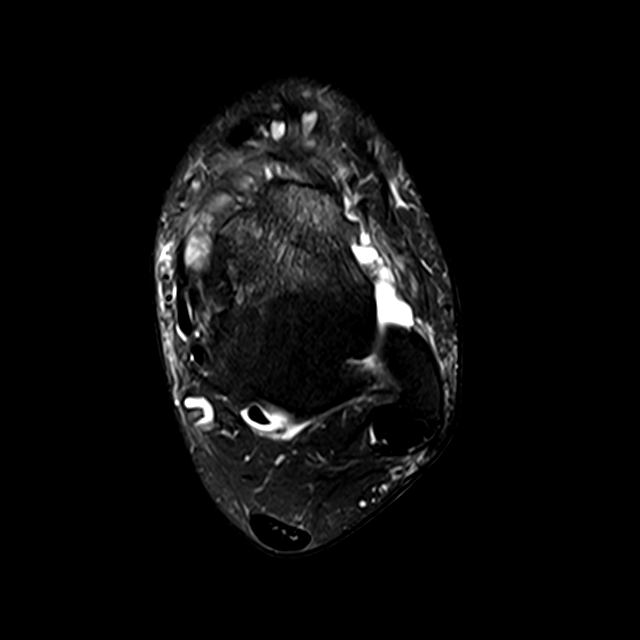
[im 31/37]
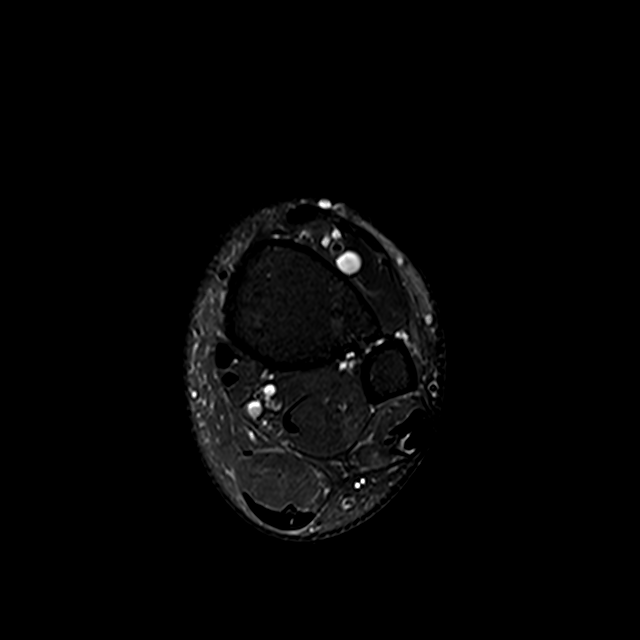

[Series 7: T2 fat-sat · sagittal · left · 2.5mm · 0.21mm/px · 3 of 30 slices shown (2 of 3)]
[im 6/30]
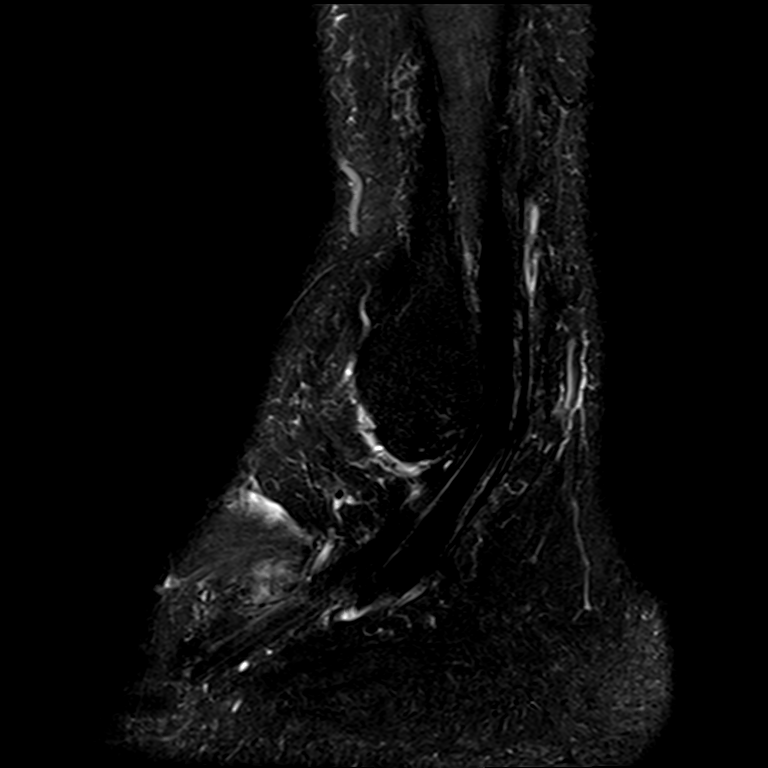
[im 18/30]
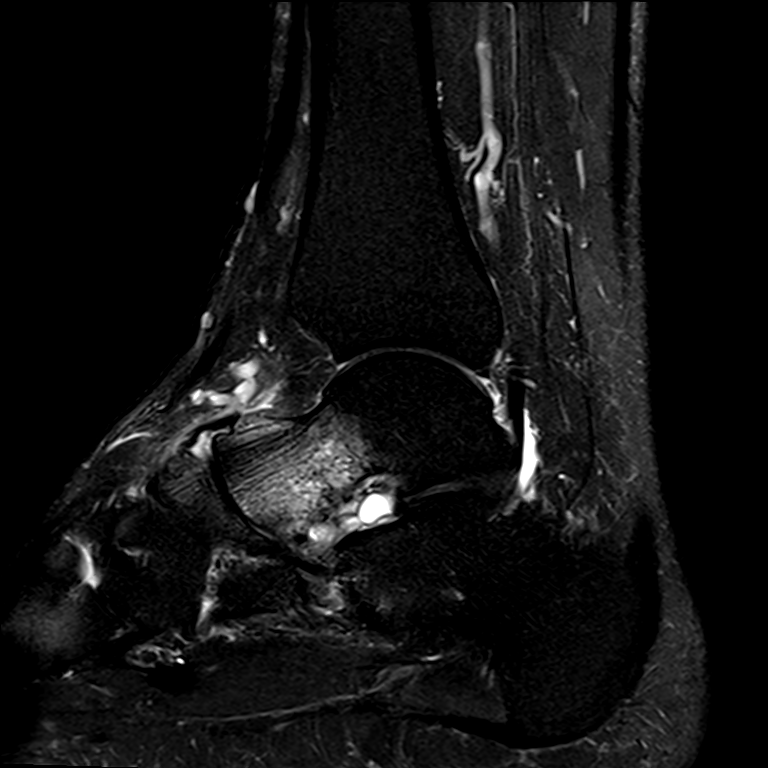
[im 30/30]
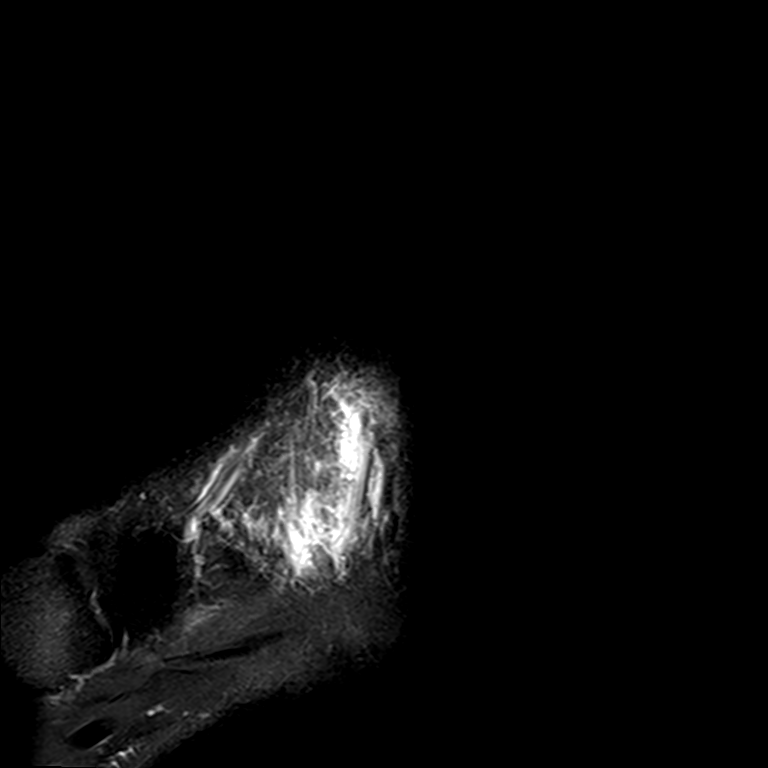

[Series 8: T2 fat-sat · coronal · left · 3.0mm · 0.25mm/px · 2 of 33 slices shown (3 of 3)]
[im 7/33]
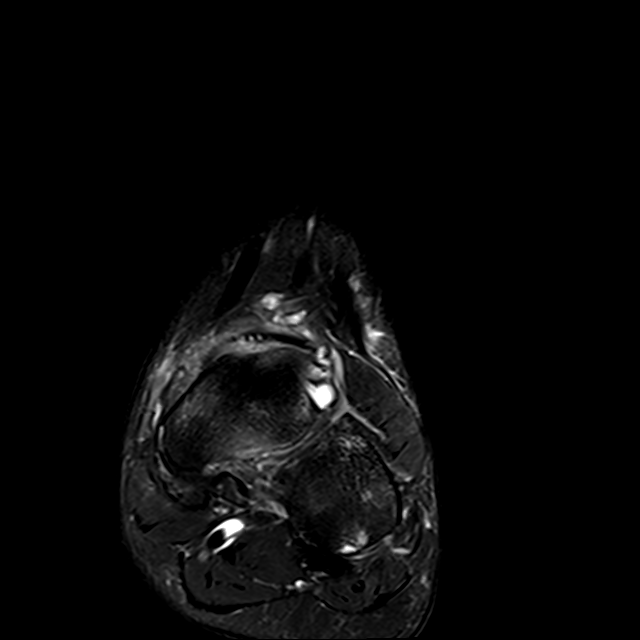
[im 20/33]
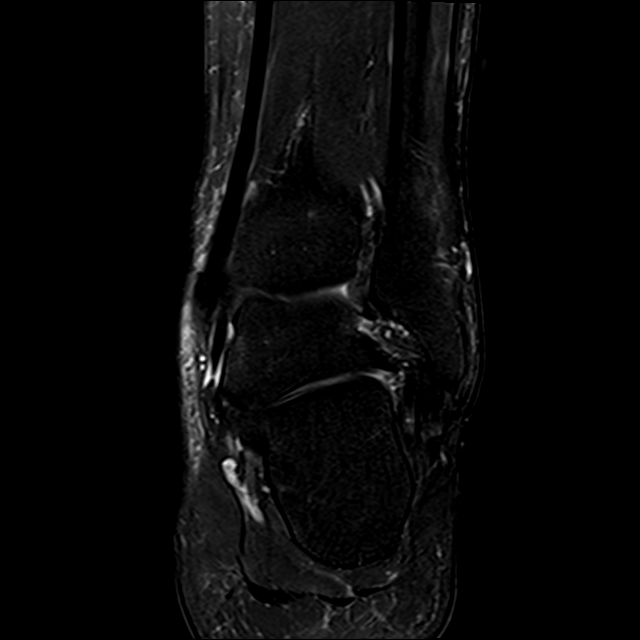

[11 of 40 positions shown; findings below may reference images not displayed]

FINDINGS: TENDONS

Peroneal: The patient has undergone repair of a peroneus brevis tear
since the prior examination. The peroneal tendons are intact.

Posteromedial: Intact.

Anterior: Intact.

Achilles: Intact.

Plantar Fascia: Normal.  Negative for plantar fasciitis.

LIGAMENTS

Lateral: Intact.

Medial: Intact.

CARTILAGE

Ankle Joint: Normal. No osteochondral lesion of the talar dome or
joint effusion.

Subtalar Joints/Sinus Tarsi: Normal fat signal within the sinus
tarsi is partially obliterated.

Bones: Again seen is bone-on-bone calcaneocuboid joint space
narrowing with subchondral edema and cyst formation about the joint.
Edema has decreased since the prior examination. There is new
extensive marrow edema in the head and neck of the talus and a
milder degree of did edema throughout the navicular bone. No
fracture is identified.

Other: No fluid collection or mass.
IMPRESSION: Since the prior examination, the patient has developed intense edema
in the head and neck of the talus and a moderate degree of edema
throughout the navicular. Findings could be due to stress change or
rapidly progressive osteoarthritis.

Age advanced osteoarthritis calcaneocuboid joint as seen on the
prior examination. Marrow edema about the joint has improved since
the prior study.

Negative for tendon or ligament tear. The patient is status post
peroneus brevis repair.

Partial obliteration of fat within the sinus tarsi worrisome for
sinus tarsus syndrome.

## 2022-11-23 ENCOUNTER — Emergency Department (HOSPITAL_BASED_OUTPATIENT_CLINIC_OR_DEPARTMENT_OTHER)
Admission: EM | Admit: 2022-11-23 | Discharge: 2022-11-23 | Disposition: A | Discharge status: Home / Self Care | Payer: BC Managed Care – PPO | Attending: Emergency Medicine | Admitting: Emergency Medicine

## 2022-11-23 ENCOUNTER — Encounter (HOSPITAL_BASED_OUTPATIENT_CLINIC_OR_DEPARTMENT_OTHER): Payer: Self-pay | Admitting: Emergency Medicine

## 2022-11-23 ENCOUNTER — Emergency Department (HOSPITAL_BASED_OUTPATIENT_CLINIC_OR_DEPARTMENT_OTHER): Payer: BC Managed Care – PPO | Admitting: Radiology

## 2022-11-23 ENCOUNTER — Other Ambulatory Visit: Payer: Self-pay

## 2022-11-23 DIAGNOSIS — M2550 Pain in unspecified joint: Secondary | ICD-10-CM

## 2022-11-23 DIAGNOSIS — M791 Myalgia, unspecified site: Secondary | ICD-10-CM | POA: Diagnosis not present

## 2022-11-23 DIAGNOSIS — Z1152 Encounter for screening for COVID-19: Secondary | ICD-10-CM | POA: Diagnosis not present

## 2022-11-23 LAB — CBC WITH DIFFERENTIAL/PLATELET
Abs Immature Granulocytes: 0.04 10*3/uL (ref 0.00–0.07)
Basophils Absolute: 0 10*3/uL (ref 0.0–0.1)
Basophils Relative: 1 %
Eosinophils Absolute: 0.1 10*3/uL (ref 0.0–0.5)
Eosinophils Relative: 1 %
HCT: 41.1 % (ref 39.0–52.0)
Hemoglobin: 14.4 g/dL (ref 13.0–17.0)
Immature Granulocytes: 1 %
Lymphocytes Relative: 18 %
Lymphs Abs: 1.1 10*3/uL (ref 0.7–4.0)
MCH: 32.1 pg (ref 26.0–34.0)
MCHC: 35 g/dL (ref 30.0–36.0)
MCV: 91.7 fL (ref 80.0–100.0)
Monocytes Absolute: 0.5 10*3/uL (ref 0.1–1.0)
Monocytes Relative: 8 %
Neutro Abs: 4.2 10*3/uL (ref 1.7–7.7)
Neutrophils Relative %: 71 %
Platelets: 209 10*3/uL (ref 150–400)
RBC: 4.48 MIL/uL (ref 4.22–5.81)
RDW: 13 % (ref 11.5–15.5)
WBC: 5.9 10*3/uL (ref 4.0–10.5)
nRBC: 0 % (ref 0.0–0.2)

## 2022-11-23 LAB — RESP PANEL BY RT-PCR (RSV, FLU A&B, COVID)  RVPGX2
Influenza A by PCR: NEGATIVE
Influenza B by PCR: NEGATIVE
Resp Syncytial Virus by PCR: NEGATIVE
SARS Coronavirus 2 by RT PCR: NEGATIVE

## 2022-11-23 LAB — BASIC METABOLIC PANEL
Anion gap: 11 (ref 5–15)
BUN: 19 mg/dL (ref 6–20)
CO2: 23 mmol/L (ref 22–32)
Calcium: 9.2 mg/dL (ref 8.9–10.3)
Chloride: 102 mmol/L (ref 98–111)
Creatinine, Ser: 0.92 mg/dL (ref 0.61–1.24)
GFR, Estimated: >60 mL/min (ref >60)
Glucose, Bld: 128 mg/dL — ABNORMAL HIGH (ref 70–99)
Potassium: 4 mmol/L (ref 3.5–5.1)
Sodium: 136 mmol/L (ref 135–145)

## 2022-11-23 MED ORDER — DOXYCYCLINE HYCLATE 100 MG PO CAPS: 100.0000 mg | ORAL_CAPSULE | Freq: Two times a day (BID) | ORAL | 0 refills | Status: AC

## 2022-11-23 MED ORDER — DOXYCYCLINE HYCLATE 100 MG PO CAPS: 100.0000 mg | ORAL_CAPSULE | Freq: Two times a day (BID) | ORAL | 0 refills | Status: DC

## 2022-11-23 NOTE — Discharge Instructions (Addendum)
You were evaluated for muscle/joint pain, cough and feeling poorly. You do not have evidence of a bacterial infection. You were negative for COVID,RSV,Flu but there are many other viruses and this is all likely viral infection. There is a possibility this is form a tick bite. We will treat you with doxycyline 100mg  one tablet two times daily for seven days. It is okay to take this with food to prevent GI illness but do not take with dairy such as milk as this can impair absorption. This medication can cause significant sun burn so make sure to wear protective clothing, sunscreen and limit direct sun exposure.You should continue to hydrate well and you can take tylenol 500mg  every 4 hours as needed and ibuprofen 600mg  every 4 hours as needed. You should follow up with your primary care doctor if this does not get better in a week or so. It was a pleasure caring for you.

## 2022-11-23 NOTE — ED Notes (Signed)
Discharge paperwork given and verbally understood. 

## 2022-11-23 NOTE — ED Triage Notes (Signed)
Pt presents to ED Pov. Pt c/o malaise, body aches, and mild cough since Monday. Pt reports dry cough.

## 2022-11-23 NOTE — ED Provider Notes (Addendum)
[place] [facility] AT [facility] Provider Note   CSN: [id number] Arrival date & time: 11/23/22  1912     History  Chief Complaint  Patient presents with   Generalized Body Aches    Roger Johnson is a 62 y.o. male.  Patient says since Monday has had intermittent dry cough, worsening fatigue, symmetric joint pains in the shoulders, elbows, bilateral 2nd and third mcp joints, knees. Denies joint swelling or erythema.Endorses some chills. Denies headaches, fever, SOB,CP,N/V,abdominal pain, diarrhea, constipation. Denies sick contacts. Has had good oral intake. Has been on cholesterol meds for 10 years, never had joint pain related to this.        Home Medications Prior to Admission medications   Medication Sig Start Date End Date Taking? Authorizing Provider  celecoxib (CELEBREX) 200 MG capsule Take 1 capsule (200 mg total) by mouth 2 (two) times daily. 04/06/22 04/06/23  McDonald, Rachelle Hora, DPM  cephALEXin (KEFLEX) 500 MG capsule Take 1 capsule (500 mg total) by mouth 3 (three) times daily. 09/08/20   Edwin Cap, DPM  dutasteride (AVODART) 0.5 MG capsule Take by mouth. 01/15/17   [provider]  ezetimibe-simvastatin (VYTORIN) 10-20 MG tablet TAKE 1 TABLET BY MOUTH DAILY 10/25/15   [provider]  gabapentin (NEURONTIN) 300 MG capsule Take 1 capsule (300 mg total) by mouth 3 (three) times daily for 7 days. 07/23/20 07/30/20  Edwin Cap, DPM  mupirocin ointment (BACTROBAN) 2 % Apply 1 application topically 2 (two) times daily. 09/08/20   Edwin Cap, DPM      Allergies    Voltaren [diclofenac]    Review of Systems   Review of Systems  All other systems reviewed and are negative.   Physical Exam Updated Vital Signs BP 125/78 (BP Location: Right Arm)   Pulse 91   Temp 100 F (37.8 C) (Oral)   Resp 18   SpO2 100%  Physical Exam Constitutional:      General: He is not in acute distress.    Appearance: Normal appearance.  He is normal weight. He is not ill-appearing or diaphoretic.  HENT:     Mouth/Throat:     Mouth: Mucous membranes are moist.     Pharynx: Oropharynx is clear.  Eyes:     General: No scleral icterus.    Extraocular Movements: Extraocular movements intact.     Conjunctiva/sclera: Conjunctivae normal.  Cardiovascular:     Rate and Rhythm: Normal rate and regular rhythm.     Pulses: Normal pulses.     Heart sounds: Normal heart sounds. No murmur heard.    No friction rub. No gallop.  Pulmonary:     Effort: Pulmonary effort is normal. No respiratory distress.     Breath sounds: Normal breath sounds. No wheezing or rales.  Abdominal:     General: Abdomen is flat. Bowel sounds are normal.     Palpations: Abdomen is soft.     Tenderness: There is no abdominal tenderness.  Musculoskeletal:        General: Tenderness present. No swelling or deformity.     Right lower leg: No edema.     Left lower leg: No edema.  Skin:    General: Skin is warm and dry.     Capillary Refill: Capillary refill takes less than 2 seconds.  Neurological:     Mental Status: He is alert.     ED Results / Procedures / Treatments   Labs (all labs ordered are listed, but only  abnormal results are displayed) Labs Reviewed  BASIC METABOLIC PANEL - Abnormal; Notable for the following components:      Result Value   Glucose, Bld 128 (*)    All other components within normal limits  RESP PANEL BY RT-PCR (RSV, FLU A&B, COVID)  [id number]  CBC WITH DIFFERENTIAL/PLATELET    EKG None  Radiology No results found.  Procedures Procedures    Medications Ordered in ED Medications - No data to display  ED Course/ Medical Decision Making/ A&P                             Medical Decision Making Patient endorses some dry cough, myalgia, symmetric joint pain, lethargy since Monday. He is afebrile, HDS with normal wbc and bmp. Pulmonary exam is unremarkable. No need for cxr given that this is most likely viral with  no systemic evidence of infection or inflammation or bacterial infection such as fever or elevated wbc and also lctab. Do think myalgia and arthralgia are related to viral illness. Flu,covid RSV negative but may be other respiratory virus or parvovirus. Do not think further testing for these is warranted. He appears overall well and is eating and drinking well. He is safe for discharge and can follow up with his primary doctor with no improvement. Given that [place] is an endemic area for ticks and he has arthralgias will empirically give doxycycline. Counseled patient on sun exposure and phototoxicity risk with this.  Amount and/or Complexity of Data Reviewed Labs: ordered. Radiology: ordered.    Final Clinical Impression(s) / ED Diagnoses Final diagnoses:  None    Rx / DC Orders ED Discharge Orders     None         Willette Cluster, MD 11/23/22 2025    Willette Cluster, MD 11/23/22 2032    Melene Plan, DO 11/23/22 2041

## 2023-07-24 ENCOUNTER — Encounter: Payer: Self-pay | Admitting: Internal Medicine

## 2023-08-20 ENCOUNTER — Ambulatory Visit (AMBULATORY_SURGERY_CENTER): Payer: BC Managed Care – PPO

## 2023-08-20 VITALS — Ht 70.0 in | Wt 182.0 lb

## 2023-08-20 DIAGNOSIS — Z860100 Personal history of colon polyps, unspecified: Secondary | ICD-10-CM

## 2023-08-20 MED ORDER — NA SULFATE-K SULFATE-MG SULF 17.5-3.13-1.6 GM/177ML PO SOLN
1.0000 | Freq: Once | ORAL | 0 refills | Status: AC
Start: 2023-08-20 — End: 2023-08-20

## 2023-08-20 NOTE — Progress Notes (Signed)
 No egg or soy allergy known to patient  No issues known to pt with past sedation with any surgeries or procedures Patient denies ever being told they had issues or difficulty with intubation  No FH of Malignant Hyperthermia Pt is not on diet pills Pt is not on  home 02  Pt is not on blood thinners  Pt denies issues with constipation  No A fib or A flutter Have any cardiac testing pending-- no  LOA: independent  Prep: [place]   Patient's chart reviewed by Cathlyn Parsons CNRA prior to previsit and patient appropriate for the [facility].  Previsit completed and red dot placed by patient's name on their procedure day (on provider's schedule).     PV completed with patient. Prep instructions sent via mychart and home address.

## 2023-08-31 ENCOUNTER — Encounter: Payer: Self-pay | Admitting: Internal Medicine

## 2023-09-06 ENCOUNTER — Encounter: Payer: Self-pay | Admitting: Internal Medicine

## 2023-09-06 ENCOUNTER — Ambulatory Visit (AMBULATORY_SURGERY_CENTER): Payer: No Typology Code available for payment source | Admitting: Internal Medicine

## 2023-09-06 VITALS — BP 126/76 | HR 67 | Temp 97.9°F | Resp 15 | Ht 70.0 in | Wt 182.0 lb

## 2023-09-06 DIAGNOSIS — K635 Polyp of colon: Secondary | ICD-10-CM | POA: Diagnosis not present

## 2023-09-06 DIAGNOSIS — Z860101 Personal history of adenomatous and serrated colon polyps: Secondary | ICD-10-CM | POA: Diagnosis not present

## 2023-09-06 DIAGNOSIS — K5730 Diverticulosis of large intestine without perforation or abscess without bleeding: Secondary | ICD-10-CM | POA: Diagnosis not present

## 2023-09-06 DIAGNOSIS — D125 Benign neoplasm of sigmoid colon: Secondary | ICD-10-CM

## 2023-09-06 DIAGNOSIS — Z1211 Encounter for screening for malignant neoplasm of colon: Secondary | ICD-10-CM

## 2023-09-06 DIAGNOSIS — Z860100 Personal history of colon polyps, unspecified: Secondary | ICD-10-CM

## 2023-09-06 DIAGNOSIS — K648 Other hemorrhoids: Secondary | ICD-10-CM | POA: Diagnosis not present

## 2023-09-06 MED ORDER — SODIUM CHLORIDE 0.9 % IV SOLN: 500.0000 mL | Freq: Once | INTRAVENOUS | Status: DC

## 2023-09-06 NOTE — Progress Notes (Signed)
 Called to room to assist during endoscopic procedure.  Patient ID and intended procedure confirmed with present staff. Received instructions for my participation in the procedure from the performing physician.

## 2023-09-06 NOTE — Patient Instructions (Signed)
Handouts given on polyps, hemorrhoids and diverticulosis.  YOU HAD AN ENDOSCOPIC PROCEDURE TODAY AT THE [facility]:   Refer to the procedure report that was given to you for any specific questions about what was found during the examination.  If the procedure report does not answer your questions, please call your gastroenterologist to clarify.  If you requested that your care partner not be given the details of your procedure findings, then the procedure report has been included in a sealed envelope for you to review at your convenience later.  YOU SHOULD EXPECT: Some feelings of bloating in the abdomen. Passage of more gas than usual.  Walking can help get rid of the air that was put into your GI tract during the procedure and reduce the bloating. If you had a lower endoscopy (such as a colonoscopy or flexible sigmoidoscopy) you may notice spotting of blood in your stool or on the toilet paper. If you underwent a bowel prep for your procedure, you may not have a normal bowel movement for a few days.  Please Note:  You might notice some irritation and congestion in your nose or some drainage.  This is from the oxygen used during your procedure.  There is no need for concern and it should clear up in a day or so.  SYMPTOMS TO REPORT IMMEDIATELY:  Following lower endoscopy (colonoscopy or flexible sigmoidoscopy):  Excessive amounts of blood in the stool  Significant tenderness or worsening of abdominal pains  Swelling of the abdomen that is new, acute  Fever of 100F or higher  For urgent or emergent issues, a gastroenterologist can be reached at any hour by calling [phone]. Do not use MyChart messaging for urgent concerns.    DIET:  We do recommend a small meal at first, but then you may proceed to your regular diet.  Drink plenty of fluids but you should avoid alcoholic beverages for 24 hours.  ACTIVITY:  You should plan to take it easy for the rest of today and you should  NOT DRIVE or use heavy machinery until tomorrow (because of the sedation medicines used during the test).    FOLLOW UP: Our staff will call the number listed on your records the next business day following your procedure.  We will call around 7:15- 8:00 am to check on you and address any questions or concerns that you may have regarding the information given to you following your procedure. If we do not reach you, we will leave a message.     If any biopsies were taken you will be contacted by phone or by letter within the next 1-3 weeks.  Please call us at [phone] if you have not heard about the biopsies in 3 weeks.    SIGNATURES/CONFIDENTIALITY: You and/or your care partner have signed paperwork which will be entered into your electronic medical record.  These signatures attest to the fact that that the information above on your After Visit Summary has been reviewed and is understood.  Full responsibility of the confidentiality of this discharge information lies with you and/or your care-partner. 

## 2023-09-06 NOTE — Op Note (Signed)
 [facility] Patient Name: Roger Johnson Procedure Date: 09/06/2023 9:18 AM MRN: [id number] Endoscopist: Wilhemina Bonito. Marina Goodell , MD, [id number] Age: 63 Referring MD:  Date of Birth: [birth year] Gender: Male Account #: [id number] Procedure:                Colonoscopy with cold snare polypectomy x 1 Indications:              High risk colon cancer surveillance: Personal                            history of non-advanced adenomas. Family history of                            colon cancer in mother around age 36. Previous                            examinations with Dr. Christella Hartigan 2013, 2019 Medicines:                Monitored Anesthesia Care Procedure:                Pre-Anesthesia Assessment:                           - Prior to the procedure, a History and Physical                            was performed, and patient medications and                            allergies were reviewed. The patient's tolerance of                            previous anesthesia was also reviewed. The risks                            and benefits of the procedure and the sedation                            options and risks were discussed with the patient.                            All questions were answered, and informed consent                            was obtained. Prior Anticoagulants: The patient has                            taken no anticoagulant or antiplatelet agents.                            After reviewing the risks and benefits, the patient                            was deemed in satisfactory condition to undergo the  procedure.                           After obtaining informed consent, the colonoscope                            was passed under direct vision. Throughout the                            procedure, the patient's blood pressure, pulse, and                            oxygen saturations were monitored continuously. The                            Olympus  Scope SN: [id number] was introduced through                            the anus and advanced to the the cecum, identified                            by appendiceal orifice and ileocecal valve. The                            ileocecal valve, appendiceal orifice, and rectum                            were photographed. The quality of the bowel                            preparation was excellent. The colonoscopy was                            performed without difficulty. The patient tolerated                            the procedure well. The bowel preparation used was                            SUPREP via split dose instruction. Scope In: 9:35:16 AM Scope Out: 9:47:51 AM Scope Withdrawal Time: 0 hours 9 minutes 37 seconds  Total Procedure Duration: 0 hours 12 minutes 35 seconds  Findings:                 A 4 mm polyp was found in the sigmoid colon. The                            polyp was removed with a cold snare. Resection and                            retrieval were complete.                           Multiple diverticula were found in the transverse  colon and left colon.                           Internal hemorrhoids were found during retroflexion.                           The exam was otherwise without abnormality on                            direct and retroflexion views. Complications:            No immediate complications. Estimated blood loss:                            None. Estimated Blood Loss:     Estimated blood loss: none. Impression:               - One 4 mm polyp in the sigmoid colon, removed with                            a cold snare. Resected and retrieved.                           - Diverticulosis in the transverse colon and in the                            left colon.                           - Internal hemorrhoids.                           - The examination was otherwise normal on direct                            and retroflexion  views. Recommendation:           - Repeat colonoscopy in 5 years for surveillance                            (family and personal history).                           - Patient has a contact number available for                            emergencies. The signs and symptoms of potential                            delayed complications were discussed with the                            patient. Return to normal activities tomorrow.                            Written discharge instructions were provided to the  patient.                           - Resume previous diet.                           - Continue present medications.                           - Await pathology results. Wilhemina Bonito. Marina Goodell, MD 09/06/2023 9:53:39 AM This report has been signed electronically.

## 2023-09-06 NOTE — Progress Notes (Signed)
 HISTORY OF PRESENT ILLNESS:  Roger Johnson is a 63 y.o. male personal history of adenomatous colon polyps sent for surveillance colonoscopy  REVIEW OF SYSTEMS:  All non-GI ROS negative except for  Past Medical History:  Diagnosis Date   Cataract    Hyperlipidemia     Past Surgical History:  Procedure Laterality Date   COLONOSCOPY  09/2017   FOOT SURGERY Left    INGUINAL HERNIA REPAIR  2010 2008   bilateral, twice    Social History Cap Massi  reports that he quit smoking about 11 years ago. His smoking use included cigarettes. He has never used smokeless tobacco. He reports current alcohol use. He reports that he does not use drugs.  family history includes Colon cancer (age of onset: 52) in his mother.  Allergies  Allergen Reactions   Voltaren [Diclofenac] Itching       PHYSICAL EXAMINATION: Vital signs: BP 124/78 (BP Location: Left Arm, Cuff Size: Normal)   Pulse 73   Temp 97.9 F (36.6 C)   Ht 5\' 10"  (1.778 m)   Wt 182 lb (82.6 kg)   SpO2 97%   BMI 26.11 kg/m  General: Well-developed, well-nourished, no acute distress HEENT: Sclerae are anicteric, conjunctiva pink. Oral mucosa intact Lungs: Clear Heart: Regular Abdomen: soft, nontender, nondistended, no obvious ascites, no peritoneal signs, normal bowel sounds. No organomegaly. Extremities: No edema Psychiatric: alert and oriented x3. Cooperative     ASSESSMENT:  History of adenomatous polyps   PLAN:  Surveillance colonoscopy

## 2023-09-06 NOTE — Progress Notes (Signed)
 Sedate, gd SR, tolerated procedure well, VSS, report to RN

## 2023-09-07 ENCOUNTER — Telehealth: Payer: Self-pay

## 2023-09-07 NOTE — Telephone Encounter (Signed)
 No answer after follow up call. Voice message left.

## 2023-09-10 ENCOUNTER — Encounter: Payer: Self-pay | Admitting: Internal Medicine

## 2023-09-10 LAB — SURGICAL PATHOLOGY
